# Patient Record
Sex: Female | Born: 1970 | Race: Black or African American | Hispanic: No | Marital: Married | Smoking: Former smoker
Health system: Southern US, Community
[De-identification: ages and names within clinical notes are randomized; demographics above are authoritative.]

## PROBLEM LIST (undated history)

## (undated) DIAGNOSIS — J449 Chronic obstructive pulmonary disease, unspecified: Secondary | ICD-10-CM

## (undated) DIAGNOSIS — J45909 Unspecified asthma, uncomplicated: Secondary | ICD-10-CM

## (undated) HISTORY — PX: TUBAL LIGATION: SHX77

---

## 2005-08-27 ENCOUNTER — Emergency Department: Payer: Self-pay | Admitting: Unknown Physician Specialty

## 2010-12-24 ENCOUNTER — Emergency Department: Payer: Self-pay | Admitting: Emergency Medicine

## 2012-10-20 ENCOUNTER — Ambulatory Visit: Payer: Self-pay

## 2012-10-23 ENCOUNTER — Ambulatory Visit: Payer: Self-pay

## 2013-12-13 ENCOUNTER — Ambulatory Visit: Payer: Self-pay

## 2015-06-02 ENCOUNTER — Other Ambulatory Visit: Payer: Self-pay | Admitting: Preventative Medicine

## 2015-06-02 DIAGNOSIS — Z1239 Encounter for other screening for malignant neoplasm of breast: Secondary | ICD-10-CM

## 2015-06-16 ENCOUNTER — Ambulatory Visit
Admission: RE | Admit: 2015-06-16 | Discharge: 2015-06-16 | Disposition: A | Discharge status: Home / Self Care | Payer: BLUE CROSS/BLUE SHIELD | Source: Ambulatory Visit | Attending: Preventative Medicine | Admitting: Preventative Medicine

## 2015-06-16 DIAGNOSIS — Z1239 Encounter for other screening for malignant neoplasm of breast: Secondary | ICD-10-CM

## 2015-06-16 DIAGNOSIS — Z1231 Encounter for screening mammogram for malignant neoplasm of breast: Secondary | ICD-10-CM | POA: Insufficient documentation

## 2016-07-03 ENCOUNTER — Other Ambulatory Visit: Payer: Self-pay | Admitting: Family Medicine

## 2016-07-03 ENCOUNTER — Other Ambulatory Visit: Payer: Self-pay | Admitting: Preventative Medicine

## 2016-07-15 ENCOUNTER — Encounter (INDEPENDENT_AMBULATORY_CARE_PROVIDER_SITE_OTHER): Payer: Self-pay

## 2016-07-15 ENCOUNTER — Ambulatory Visit
Admission: RE | Admit: 2016-07-15 | Discharge: 2016-07-15 | Disposition: A | Discharge status: Home / Self Care | Payer: Self-pay | Source: Ambulatory Visit | Attending: Oncology | Admitting: Oncology

## 2016-07-15 ENCOUNTER — Encounter: Payer: Self-pay | Admitting: *Deleted

## 2016-07-15 ENCOUNTER — Ambulatory Visit: Payer: Self-pay | Attending: Oncology | Admitting: *Deleted

## 2016-07-15 VITALS — BP 128/85 | HR 86 | Temp 98.1°F | Ht 64.57 in | Wt 195.8 lb

## 2016-07-15 DIAGNOSIS — Z0000 Encounter for general adult medical examination without abnormal findings: Secondary | ICD-10-CM

## 2016-07-15 NOTE — Patient Instructions (Signed)
Gave patient hand-out, Women Staying Healthy, Active and Well from [facility], with education on breast health, pap smears, heart and colon health. 

## 2016-07-15 NOTE — Progress Notes (Signed)
Subjective:     Patient ID: Nicole Mason, female   DOB: 1970/10/12, 45 y.o.   MRN: [id number]  HPI   Review of Systems     Objective:   Physical Exam  Pulmonary/Chest: Right breast exhibits no inverted nipple, no mass, no nipple discharge, no skin change and no tenderness. Left breast exhibits no inverted nipple, no mass, no nipple discharge, no skin change and no tenderness. Breasts are symmetrical.       Assessment:     45 [facility]  Clinical breast exam unremarkable.  Taught self breast awareness.  Patient has been screened for eligibility.  She does not have any insurance, Medicare or Medicaid.  She also meets financial eligibility.  Hand-out given on the Affordable Care Act.    Plan:     Screening mammogram ordered.  Will follow-up per [facility] protocol.

## 2016-07-16 ENCOUNTER — Encounter: Payer: Self-pay | Admitting: *Deleted

## 2016-07-16 NOTE — Progress Notes (Signed)
Letter mailed from the [facility] to inform patient of her normal mammogram results.  Patient is to follow-up with annual screening in one year.  [facility] to [place]. 

## 2017-08-25 ENCOUNTER — Encounter (INDEPENDENT_AMBULATORY_CARE_PROVIDER_SITE_OTHER): Payer: Self-pay

## 2017-08-25 ENCOUNTER — Other Ambulatory Visit: Payer: Self-pay

## 2017-08-25 ENCOUNTER — Ambulatory Visit
Admission: RE | Admit: 2017-08-25 | Discharge: 2017-08-25 | Disposition: A | Discharge status: Home / Self Care | Payer: Self-pay | Source: Ambulatory Visit | Attending: Oncology | Admitting: Oncology

## 2017-08-25 ENCOUNTER — Ambulatory Visit: Payer: Self-pay | Attending: Oncology

## 2017-08-25 VITALS — Ht 64.0 in | Wt 204.0 lb

## 2017-08-25 DIAGNOSIS — Z0000 Encounter for general adult medical examination without abnormal findings: Secondary | ICD-10-CM

## 2017-08-25 NOTE — Progress Notes (Signed)
Phoned patient with Bir[facility] 1 mammogram result.  Patient  Instructed to return for annual screening.  Copy to [facility]IS.

## 2017-08-25 NOTE — Progress Notes (Signed)
Subjective:     Patient ID: Nicole Mason, female   DOB: [birth year], 47 y.o.   MRN: [id number]  HPI   Review of Systems     Objective:   Physical Exam  Pulmonary/Chest: Right breast exhibits no inverted nipple, no mass, no nipple discharge, no skin change and no tenderness. Left breast exhibits no inverted nipple, no mass, no nipple discharge, no skin change and no tenderness. Breasts are symmetrical.    Bilateral symmetrical upper breast fibroglandular tissue       Assessment:  47 year old patient presents for [facility] clinic visit.  Patient screened, and meets [facility] eligibility.  Patient does not have insurance, Medicare or Medicaid.  Handout given on Affordable Care Act.  Instructed patient on breast self-exam using teach back method.  CBE unremarkable.  No mass or lump palpated.   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    Plan:     Sent for bilateral screening mammogram.

## 2018-10-14 ENCOUNTER — Encounter (INDEPENDENT_AMBULATORY_CARE_PROVIDER_SITE_OTHER): Payer: Self-pay

## 2018-10-14 ENCOUNTER — Ambulatory Visit
Admission: RE | Admit: 2018-10-14 | Discharge: 2018-10-14 | Disposition: A | Discharge status: Home / Self Care | Payer: Self-pay | Source: Ambulatory Visit | Attending: Oncology | Admitting: Oncology

## 2018-10-14 ENCOUNTER — Other Ambulatory Visit: Payer: Self-pay

## 2018-10-14 ENCOUNTER — Ambulatory Visit: Payer: Self-pay | Attending: Oncology

## 2018-10-14 VITALS — BP 114/87 | HR 121 | Temp 97.4°F | Ht 64.0 in | Wt 217.0 lb

## 2018-10-14 DIAGNOSIS — Z0000 Encounter for general adult medical examination without abnormal findings: Secondary | ICD-10-CM

## 2018-10-14 NOTE — Progress Notes (Signed)
  Subjective:     Patient ID: Nicole Mason, female   DOB: 08-16-1970, 48 y.o.   MRN: [id number]  HPI   Review of Systems     Objective:   Physical Exam Chest:     Breasts:        Right: No swelling, bleeding, inverted nipple, mass, nipple discharge, skin change or tenderness.        Left: No swelling, bleeding, inverted nipple, mass, nipple discharge, skin change or tenderness.        Assessment:     48 year old patient returns to [facility] for annual screening.  States she is no longer working.  Patient screened, and meets [facility] eligibility.  Patient does not have insurance, Medicare or Medicaid.  Handout given on Affordable Care Act. Clinical breast exam unremarkable.  No mass or lump palpated.  Instructed patient on breast self awareness using teach back method.  Patient reports her mother was diagnosed with breast cancer at age 21, and is now deceased.  Patient thinks she has had genetic testing performed at [facility].      Plan:     Sent for bilateral screening mammogram.

## 2018-10-21 NOTE — Progress Notes (Signed)
Letter mailed from [facility] to notify of normal mammogram results.  Patient to return in one year for annual screening.  Copy to [facility]IS. 

## 2019-10-19 ENCOUNTER — Other Ambulatory Visit: Payer: Self-pay

## 2019-10-19 ENCOUNTER — Ambulatory Visit: Payer: Self-pay | Attending: Oncology

## 2019-10-19 ENCOUNTER — Ambulatory Visit
Admission: RE | Admit: 2019-10-19 | Discharge: 2019-10-19 | Disposition: A | Discharge status: Home / Self Care | Payer: Self-pay | Source: Ambulatory Visit | Attending: Oncology | Admitting: Oncology

## 2019-10-19 VITALS — BP 151/85 | HR 78 | Temp 96.6°F | Ht 64.5 in | Wt 193.0 lb

## 2019-10-19 DIAGNOSIS — Z803 Family history of malignant neoplasm of breast: Secondary | ICD-10-CM | POA: Insufficient documentation

## 2019-10-19 DIAGNOSIS — Z1231 Encounter for screening mammogram for malignant neoplasm of breast: Secondary | ICD-10-CM | POA: Insufficient documentation

## 2019-10-19 DIAGNOSIS — Z0000 Encounter for general adult medical examination without abnormal findings: Secondary | ICD-10-CM

## 2019-10-19 NOTE — Progress Notes (Signed)
  Subjective:     Patient ID: Nicole Mason, female   DOB: 1970-09-07, 49 y.o.   MRN: [id number]  HPI   Review of Systems     Objective:   Physical Exam Chest:     Breasts:        Right: No swelling, bleeding, inverted nipple, mass, nipple discharge, skin change or tenderness.        Left: No swelling, bleeding, inverted nipple, mass, nipple discharge, skin change or tenderness.          Assessment:     49 year old patient returns for [facility] clinic visit.  Patient has lost 20 lbs. since 2020 visit, and is very excited.  She is exercising with 33 year old grandson, and eating healthy meals. Patient screened, and meets [facility] eligibility.  Patient does not have insurance, Medicare or Medicaid. Instructed patient on breast self awareness using teach back method.  Clinical breast exam unremarkable. No mass or lump palpated.     Risk Assessment    Risk Scores      10/19/2019 10/14/2018   Last edited by: Jim Like, RN Scarlett Presto, RN   5-year risk: 1.8 % 1.7 %   Lifetime risk: 13.7 % 13.9 %         Plan:     Sent for bilateral screening mammogram.

## 2019-10-25 NOTE — Progress Notes (Signed)
Letter mailed from [facility] to notify of normal mammogram results.  Patient to return in one year for annual screening.  Copy to [facility]IS. 

## 2020-10-18 ENCOUNTER — Ambulatory Visit: Payer: Self-pay

## 2020-11-14 ENCOUNTER — Encounter: Payer: Self-pay | Admitting: Nurse Practitioner

## 2020-11-15 ENCOUNTER — Other Ambulatory Visit: Payer: Self-pay

## 2020-11-15 ENCOUNTER — Ambulatory Visit: Payer: Self-pay | Attending: Oncology

## 2020-11-15 ENCOUNTER — Ambulatory Visit
Admission: RE | Admit: 2020-11-15 | Discharge: 2020-11-15 | Disposition: A | Discharge status: Home / Self Care | Payer: Self-pay | Source: Ambulatory Visit | Attending: Oncology | Admitting: Oncology

## 2020-11-15 VITALS — BP 130/93 | HR 86 | Temp 98.2°F | Ht 63.0 in | Wt 178.2 lb

## 2020-11-15 DIAGNOSIS — Z0000 Encounter for general adult medical examination without abnormal findings: Secondary | ICD-10-CM | POA: Insufficient documentation

## 2020-11-15 NOTE — Progress Notes (Addendum)
  Subjective:     Patient ID: Nicole Mason, female   DOB: 1970-09-15, 50 y.o.   MRN: [id number]  HPI   Review of Systems     Objective:   Physical Exam Chest:  Breasts:     Right: No swelling, bleeding, inverted nipple, mass, nipple discharge, skin change or tenderness.     Left: No swelling, bleeding, inverted nipple, mass, nipple discharge, skin change or tenderness.          Assessment:     50 year old patient returns for [facility] screening.  Patient screened, and meets [facility] eligibility.  Patient does not have insurance, Medicare or Medicaid.  Instructed patient on breast self awareness using teach back method.  Clinical breast exam unremarkable.  No mass or lump palpated. Pelvic exam normal.   Patient is working out out, dancing, walking, to maintain weight loss. Risk Assessment    Risk Scores      11/15/2020 10/19/2019   Last edited by: Jim Like, RN Jim Like, RN   5-year risk: 1.8 % 1.8 %   Lifetime risk: 13.5 % 13.7 %            Plan:     Sent for bilateral screening mammogram.  Specimen collected for pap.

## 2020-11-20 LAB — IGP, APTIMA HPV: HPV Aptima: NEGATIVE

## 2020-11-21 NOTE — Progress Notes (Signed)
Phoned patient with normal mammogram, and Pap results.  Next pap due in 5 years.  To return next year for annual screening.  Copy to [facility]IS.

## 2021-04-06 ENCOUNTER — Encounter: Payer: Self-pay | Admitting: Emergency Medicine

## 2021-04-06 ENCOUNTER — Emergency Department: Payer: Self-pay

## 2021-04-06 ENCOUNTER — Inpatient Hospital Stay
Admission: EM | Admit: 2021-04-06 | Discharge: 2021-04-09 | DRG: 871 | Disposition: A | Discharge status: Home / Self Care | Payer: Self-pay | Attending: Internal Medicine | Admitting: Internal Medicine

## 2021-04-06 ENCOUNTER — Other Ambulatory Visit: Payer: Self-pay

## 2021-04-06 DIAGNOSIS — J9601 Acute respiratory failure with hypoxia: Secondary | ICD-10-CM | POA: Diagnosis present

## 2021-04-06 DIAGNOSIS — D649 Anemia, unspecified: Secondary | ICD-10-CM | POA: Diagnosis present

## 2021-04-06 DIAGNOSIS — R0602 Shortness of breath: Secondary | ICD-10-CM | POA: Diagnosis present

## 2021-04-06 DIAGNOSIS — Z20822 Contact with and (suspected) exposure to covid-19: Secondary | ICD-10-CM | POA: Diagnosis present

## 2021-04-06 DIAGNOSIS — Z885 Allergy status to narcotic agent status: Secondary | ICD-10-CM

## 2021-04-06 DIAGNOSIS — J9611 Chronic respiratory failure with hypoxia: Secondary | ICD-10-CM

## 2021-04-06 DIAGNOSIS — E876 Hypokalemia: Secondary | ICD-10-CM | POA: Diagnosis present

## 2021-04-06 DIAGNOSIS — G8929 Other chronic pain: Secondary | ICD-10-CM | POA: Diagnosis present

## 2021-04-06 DIAGNOSIS — J189 Pneumonia, unspecified organism: Principal | ICD-10-CM | POA: Diagnosis present

## 2021-04-06 DIAGNOSIS — R062 Wheezing: Secondary | ICD-10-CM

## 2021-04-06 DIAGNOSIS — Z803 Family history of malignant neoplasm of breast: Secondary | ICD-10-CM

## 2021-04-06 DIAGNOSIS — A419 Sepsis, unspecified organism: Principal | ICD-10-CM | POA: Diagnosis present

## 2021-04-06 DIAGNOSIS — M545 Low back pain, unspecified: Secondary | ICD-10-CM | POA: Diagnosis present

## 2021-04-06 DIAGNOSIS — F17210 Nicotine dependence, cigarettes, uncomplicated: Secondary | ICD-10-CM | POA: Diagnosis present

## 2021-04-06 DIAGNOSIS — Z8616 Personal history of COVID-19: Secondary | ICD-10-CM

## 2021-04-06 LAB — CBC WITH DIFFERENTIAL/PLATELET
Abs Immature Granulocytes: 0.04 10*3/uL (ref 0.00–0.07)
Basophils Absolute: 0 10*3/uL (ref 0.0–0.1)
Basophils Relative: 0 %
Eosinophils Absolute: 0.1 10*3/uL (ref 0.0–0.5)
Eosinophils Relative: 1 %
HCT: 41.4 % (ref 36.0–46.0)
Hemoglobin: 13.9 g/dL (ref 12.0–15.0)
Immature Granulocytes: 0 %
Lymphocytes Relative: 10 %
Lymphs Abs: 1 10*3/uL (ref 0.7–4.0)
MCH: 31.7 pg (ref 26.0–34.0)
MCHC: 33.6 g/dL (ref 30.0–36.0)
MCV: 94.3 fL (ref 80.0–100.0)
Monocytes Absolute: 0.5 10*3/uL (ref 0.1–1.0)
Monocytes Relative: 5 %
Neutro Abs: 8.1 10*3/uL — ABNORMAL HIGH (ref 1.7–7.7)
Neutrophils Relative %: 84 %
Platelets: 263 10*3/uL (ref 150–400)
RBC: 4.39 MIL/uL (ref 3.87–5.11)
RDW: 12.8 % (ref 11.5–15.5)
WBC: 9.8 10*3/uL (ref 4.0–10.5)
nRBC: 0 % (ref 0.0–0.2)

## 2021-04-06 LAB — COMPREHENSIVE METABOLIC PANEL
ALT: 21 U/L (ref 0–44)
AST: 24 U/L (ref 15–41)
Albumin: 4.1 g/dL (ref 3.5–5.0)
Alkaline Phosphatase: 102 U/L (ref 38–126)
Anion gap: 9 (ref 5–15)
BUN: 8 mg/dL (ref 6–20)
CO2: 26 mmol/L (ref 22–32)
Calcium: 9.4 mg/dL (ref 8.9–10.3)
Chloride: 103 mmol/L (ref 98–111)
Creatinine, Ser: 0.65 mg/dL (ref 0.44–1.00)
GFR, Estimated: >60 mL/min (ref >60)
Glucose, Bld: 114 mg/dL — ABNORMAL HIGH (ref 70–99)
Potassium: 3.4 mmol/L — ABNORMAL LOW (ref 3.5–5.1)
Sodium: 138 mmol/L (ref 135–145)
Total Bilirubin: 0.7 mg/dL (ref 0.3–1.2)
Total Protein: 8.2 g/dL — ABNORMAL HIGH (ref 6.5–8.1)

## 2021-04-06 LAB — URINALYSIS, COMPLETE (UACMP) WITH MICROSCOPIC
Bacteria, UA: NONE SEEN
Bilirubin Urine: NEGATIVE
Glucose, UA: NEGATIVE mg/dL
Ketones, ur: 5 mg/dL — AB
Leukocytes,Ua: NEGATIVE
Nitrite: NEGATIVE
Protein, ur: NEGATIVE mg/dL
Specific Gravity, Urine: >1.046 — ABNORMAL HIGH (ref 1.005–1.030)
pH: 5 (ref 5.0–8.0)

## 2021-04-06 LAB — TROPONIN I (HIGH SENSITIVITY): Troponin I (High Sensitivity): 6 ng/L (ref <18)

## 2021-04-06 LAB — RESP PANEL BY RT-PCR (FLU A&B, COVID) ARPGX2
Influenza A by PCR: NEGATIVE
Influenza B by PCR: NEGATIVE
SARS Coronavirus 2 by RT PCR: NEGATIVE

## 2021-04-06 LAB — PROCALCITONIN: Procalcitonin: <0.1 ng/mL

## 2021-04-06 LAB — BRAIN NATRIURETIC PEPTIDE: B Natriuretic Peptide: 34.6 pg/mL (ref 0.0–100.0)

## 2021-04-06 LAB — LACTIC ACID, PLASMA: Lactic Acid, Venous: 1.8 mmol/L (ref 0.5–1.9)

## 2021-04-06 LAB — HIV ANTIBODY (ROUTINE TESTING W REFLEX): HIV Screen 4th Generation wRfx: NONREACTIVE

## 2021-04-06 MED ORDER — ONDANSETRON HCL 4 MG PO TABS: 4.0000 mg | ORAL_TABLET | Freq: Four times a day (QID) | ORAL | Status: DC | PRN

## 2021-04-06 MED ORDER — SODIUM CHLORIDE 0.9 % IV SOLN
500.0000 mg | INTRAVENOUS | Status: DC
  Administered 2021-04-06 – 2021-04-08 (×3): 500 mg via INTRAVENOUS
  Filled 2021-04-06 (×4): qty 500

## 2021-04-06 MED ORDER — ACETAMINOPHEN 500 MG PO TABS
1000.0000 mg | ORAL_TABLET | Freq: Four times a day (QID) | ORAL | Status: DC | PRN
  Administered 2021-04-06: 1000 mg via ORAL
  Filled 2021-04-06: qty 2

## 2021-04-06 MED ORDER — ENOXAPARIN SODIUM 40 MG/0.4ML IJ SOSY
40.0000 mg | PREFILLED_SYRINGE | INTRAMUSCULAR | Status: DC
  Administered 2021-04-06 – 2021-04-08 (×3): 40 mg via SUBCUTANEOUS
  Filled 2021-04-06 (×3): qty 0.4

## 2021-04-06 MED ORDER — ACETAMINOPHEN 650 MG RE SUPP: 650.0000 mg | Freq: Four times a day (QID) | RECTAL | Status: DC | PRN

## 2021-04-06 MED ORDER — ONDANSETRON HCL 4 MG/2ML IJ SOLN: 4.0000 mg | Freq: Four times a day (QID) | INTRAMUSCULAR | Status: DC | PRN

## 2021-04-06 MED ORDER — METHYLPREDNISOLONE SODIUM SUCC 125 MG IJ SOLR
80.0000 mg | INTRAMUSCULAR | Status: AC
Start: 2021-04-07 — End: 2021-04-08
  Administered 2021-04-07 – 2021-04-08 (×2): 80 mg via INTRAVENOUS
  Filled 2021-04-06 (×2): qty 2

## 2021-04-06 MED ORDER — SODIUM CHLORIDE 0.9 % IV SOLN
2.0000 g | INTRAVENOUS | Status: DC
  Administered 2021-04-06 – 2021-04-08 (×3): 2 g via INTRAVENOUS
  Filled 2021-04-06 (×3): qty 20
  Filled 2021-04-06: qty 2

## 2021-04-06 MED ORDER — IPRATROPIUM-ALBUTEROL 0.5-2.5 (3) MG/3ML IN SOLN
3.0000 mL | Freq: Once | RESPIRATORY_TRACT | Status: AC
  Administered 2021-04-06: 3 mL via RESPIRATORY_TRACT
  Filled 2021-04-06: qty 3

## 2021-04-06 MED ORDER — IOHEXOL 350 MG/ML SOLN
75.0000 mL | Freq: Once | INTRAVENOUS | Status: AC | PRN
  Administered 2021-04-06: 75 mL via INTRAVENOUS

## 2021-04-06 MED ORDER — SODIUM CHLORIDE 0.9 % IV BOLUS
500.0000 mL | Freq: Once | INTRAVENOUS | Status: AC
  Administered 2021-04-06: 500 mL via INTRAVENOUS

## 2021-04-06 MED ORDER — METHYLPREDNISOLONE SODIUM SUCC 40 MG IJ SOLR
40.0000 mg | Freq: Two times a day (BID) | INTRAMUSCULAR | Status: DC
  Filled 2021-04-06: qty 1

## 2021-04-06 MED ORDER — SODIUM CHLORIDE 0.9 % IV BOLUS
1000.0000 mL | Freq: Once | INTRAVENOUS | Status: AC
  Administered 2021-04-06: 1000 mL via INTRAVENOUS

## 2021-04-06 MED ORDER — POTASSIUM CITRATE-CITRIC ACID 1100-334 MG/5ML PO SOLN
20.0000 meq | Freq: Once | ORAL | Status: AC
  Administered 2021-04-06: 20 meq via ORAL
  Filled 2021-04-06: qty 10

## 2021-04-06 NOTE — H&P (Signed)
History and Physical   Nicole Mason HER:[id number] DOB: 1970/08/26 DOA: 04/06/2021  PCP: Pcp, No  Patient coming from: home  I have personally briefly reviewed patient's old medical records in [place].  Chief Concern: Shortness of breath  HPI: Nicole Mason is a 50 y.o. female with medical history significant for chronic low back pain, frequently takes Goody powder, presents emergency department for chief concerns of shortness of breath cough, hypoxia.  At bedside patient was able to tell me her name, age, current calendar year, and her location.  She can identify her husband at bedside.  She reports that the symptoms started about Three weeks ago.  She states that she was given steroids, inhaler, antibiotics twice per day for seven days at urgent care/pharmacy center.  She states she was never tested for COVID-19 at that time.  She reports coughing that started about 1-2 weeks ago after completion of antibiotics and steroids that was prescribed to her from the outpatient urgent care.  She developed difficulty breathing for two days and it worsened today. She reports that ambulation around the house caused her to have difficulty breathing prompting her to present to the ED for further evaluation.  She endorses chest pain and abdominl pain with cough.   She denies nausea, vomiting, dysuria, hematuria. She endorsed diarrhea three weeks ago.   Social history: She lives with her husband, MIL, step son. quit 3 days ago, at her peak, she smoked 1/2 ppd. She denies etoh and recreational drug use. She has a store.   Vaccination history: She is vaccinated for covid 19, three doses of Pfizer.   ROS: Constitutional: no weight change, no fever ENT/Mouth: no sore throat, no rhinorrhea Eyes: no eye pain, no vision changes Cardiovascular: no chest pain, no dyspnea,  no edema, no palpitations Respiratory: no cough, no sputum, no wheezing Gastrointestinal: no nausea, no vomiting, no  diarrhea, no constipation Genitourinary: no urinary incontinence, no dysuria, no hematuria Musculoskeletal: no arthralgias, no myalgias Skin: no skin lesions, no pruritus, Neuro: + weakness, no loss of consciousness, no syncope Psych: no anxiety, no depression, + decrease appetite Heme/Lymph: no bruising, no bleeding  ED Course: Discussed with emergency medicine provider, patient requiring hospitalization for chief concerns of hypoxia with ambulation.  Per EDP, patient desatted to 87% on room air and with movement.  Vitals in the emergency department was remarkable for initially her temperature of 99.1, respiration rate of 16, heart rate 108, blood pressure 127/87, SPO2 of 99% on 2 L nasal cannula.  Labs in the emergency department was remarkable for serum sodium 138, potassium 3.4, chloride 103, bicarb 26, BUN of 8, serum creatinine of 0.65, nonfasting blood glucose 114, WBC 9.8, hemoglobin 13.9, platelets 263.  eGFR greater than 60.  COVID PCR was negative.  BNP 34.6.  Troponin was 6.  Lactic acid 1.8.  Pro-Cal less than 0.1.  EDP gave patient 1 treatment of duo nebs, sodium chloride a total of 2.5 L bolus.  1 dose of ceftriaxone and azithromycin.  Assessment/Plan  Active Problems:   Shortness of breath   # Shortness of breath with wheezing on auscultation # Requiring oxygen supplementation - CTA was positive for multifocal pneumonia - My clinical suspicion is that patient had COVID 3 weeks ago however she was never tested -Continue azithromycin and ceftriaxone at this time - Solu-Medrol twice daily  # Hypokalemia-Polycitra 20 mill equivalent once - A.m. magnesium check  # Chronic low back pain-extensive counseling to patient regarding cessation of  Goody powder - Advised patient to alternate of Goody powder if absolutely needed with acetaminophen - Stretching as tolerated including yoga  Chart reviewed.   DVT prophylaxis: Enoxaparin 40 mg subcutaneous Code Status: Full  code Diet: Heart healthy Family Communication: Updated spouse at bedside Disposition Plan: Pending clinical course Consults called: None at this time Admission status: MedSurg, observation, telemetry ordered for 24 hours  History reviewed. No pertinent past medical history.  Past Surgical History:  Procedure Laterality Date   TUBAL LIGATION     Social History:  reports that she quit smoking 2 days ago. Her smoking use included cigarettes. She has a 12.50 pack-year smoking history. She does not have any smokeless tobacco history on file. No history on file for alcohol use and drug use.  Allergies  Allergen Reactions   Oxycodone-Acetaminophen     Other reaction(s): Other (See Comments)   Family History  Problem Relation Age of Onset   Breast cancer Mother 12   Family history: Family history reviewed and not pertinent  Prior to Admission medications   Goody powder   Physical Exam: Vitals:   04/06/21 1330 04/06/21 1700 04/06/21 1800 04/06/21 1900  BP: 129/75 126/69 (!) 128/99 134/79  Pulse: (!) 118 (!) 107 (!) 103 (!) 103  Resp: (!) 23 (!) 27    Temp:      TempSrc:      SpO2: 96% 97% 99% 95%  Weight:      Height:       Constitutional: appears younger than chronological age, NAD, calm, comfortable Eyes: PERRL, lids and conjunctivae normal ENMT: Mucous membranes are moist. Posterior pharynx clear of any exudate or lesions. Age-appropriate dentition. Hearing appropriate Neck: normal, supple, no masses, no thyromegaly Respiratory: + diffuse wheezing on auscultation, no crackles. Normal respiratory effort. No accessory muscle use.  Cardiovascular: Regular rate and rhythm, no murmurs / rubs / gallops. No extremity edema. 2+ pedal pulses. No carotid bruits.  Abdomen: no tenderness, no masses palpated, no hepatosplenomegaly. Bowel sounds positive.  Musculoskeletal: no clubbing / cyanosis. No joint deformity upper and lower extremities. Good ROM, no contractures, no atrophy. Normal  muscle tone.  Skin: no rashes, lesions, ulcers. No induration Neurologic: Sensation intact. Strength 5/5 in all 4.  Psychiatric: Normal judgment and insight. Alert and oriented x 3. Normal mood.   EKG: independently reviewed, showing sinus tachycardia with rate of 116, QTc 419, LVH  Chest x-ray on Admission: I personally reviewed and I agree with radiologist reading as below.  DG Chest 2 View  Result Date: 04/06/2021 CLINICAL DATA:  Cough, hypoxia.  Two week history of cough. EXAM: CHEST - 2 VIEW COMPARISON:  Chest radiograph 12/25/2010 FINDINGS: The heart size and mediastinal contours are within normal limits. Both lungs are clear. No pleural effusion or pneumothorax. Multilevel degenerative changes in the distal thoracic spine. IMPRESSION: No active cardiopulmonary disease. Electronically Signed   By: Ileana Roup M.D.   On: 04/06/2021 12:42   CT Angio Chest PE W and/or Wo Contrast  Result Date: 04/06/2021 CLINICAL DATA:  50 year old female with cough, suspected pulmonary embolism. EXAM: CT ANGIOGRAPHY CHEST WITH CONTRAST TECHNIQUE: Multidetector CT imaging of the chest was performed using the standard protocol during bolus administration of intravenous contrast. Multiplanar CT image reconstructions and MIPs were obtained to evaluate the vascular anatomy. CONTRAST:  Seventy-five mL Omnipaque 350, intravenous COMPARISON:  None. FINDINGS: Cardiovascular: Satisfactory opacification of the pulmonary arteries to the segmental level. No evidence of pulmonary embolism. Normal heart size. No pericardial effusion. Mediastinum/Nodes:  No enlarged mediastinal, hilar, or axillary lymph nodes. Thyroid gland, trachea, and esophagus demonstrate no significant findings. Lungs/Pleura: Scattered bilateral, upper lobe predominant geographical foci of consolidative opacification. No pneumothorax or pleural effusion. No suspicious pulmonary nodules. Upper Abdomen: The visualized upper abdomen is within normal limits.  Musculoskeletal: No chest wall abnormality. No acute or significant osseous findings. Review of the MIP images confirms the above findings. IMPRESSION: Vascular: No evidence of pulmonary embolism. Non-Vascular: Multifocal pneumonia, predominant in the upper lobes. Ruthann Cancer, MD [facility] Electronically Signed   By: Ruthann Cancer M.D.   On: 04/06/2021 13:34    Labs on Admission: I have personally reviewed following labs  CBC: Recent Labs  Lab 04/06/21 1101  WBC 9.8  NEUTROABS 8.1*  HGB 13.9  HCT 41.4  MCV 94.3  PLT 588   Basic Metabolic Panel: Recent Labs  Lab 04/06/21 1101  NA 138  K 3.4*  CL 103  CO2 26  GLUCOSE 114*  BUN 8  CREATININE 0.65  CALCIUM 9.4   GFR: Estimated Creatinine Clearance: 84.7 mL/min (by C-G formula based on SCr of 0.65 mg/dL).  Liver Function Tests: Recent Labs  Lab 04/06/21 1101  AST 24  ALT 21  ALKPHOS 102  BILITOT 0.7  PROT 8.2*  ALBUMIN 4.1   Dr. Tobie Poet [facility]  If 7PM-7AM, please contact overnight-coverage provider If 7AM-7PM, please contact day coverage provider [email or web address]  04/06/2021, 8:30 PM

## 2021-04-06 NOTE — ED Notes (Signed)
Family at bedside assisted the patient to the room commode.

## 2021-04-06 NOTE — ED Provider Notes (Signed)
[facility] Emergency Department Provider Note  ____________________________________________   Event Date/Time   First MD Initiated Contact with Patient 04/06/21 1107     (approximate)  I have reviewed the triage vital signs and the nursing notes.   HISTORY  Chief Complaint Cough    HPI Nicole Mason is a 50 y.o. female with smoking history who comes in with concern for continued cough.  Patient states that she was seen about a month ago at urgent care for cough and they did not do a chest x-ray or COVID swab and they started her on steroids, inhaler, antibiotics.  She is unsure which antibiotics.  She states that she was taking that and started to feel better but she continues to have a cough.  This has been intermittent, nothing makes it better or worse.  She also reports some pain with the coughing in her chest.  Denies any abdominal pain at this time, urinary symptoms.  Patient was noted to be satting 89% in triage so placed on 2 L          History reviewed. No pertinent past medical history.  There are no problems to display for this patient.   Past Surgical History:  Procedure Laterality Date   TUBAL LIGATION      Prior to Admission medications   Not on File    Allergies Oxycodone-acetaminophen  Family History  Problem Relation Age of Onset   Breast cancer Mother 55    Social History Social History   Tobacco Use   Smoking status: Former    Packs/day: 0.50    Years: 25.00    Pack years: 12.50    Types: Cigarettes    Quit date: 04/04/2021      Review of Systems Constitutional: No fever/chills Eyes: No visual changes. ENT: No sore throat. Cardiovascular: Positive chest pain Respiratory: Positive for SOB, getting better.  Cough Gastrointestinal: No abdominal pain.  No nausea, no vomiting.  No diarrhea.  No constipation. Genitourinary: Negative for dysuria. Musculoskeletal: Negative for back pain. Skin: Negative for  rash. Neurological: Negative for headaches, focal weakness or numbness. All other ROS negative ____________________________________________   PHYSICAL EXAM:  VITAL SIGNS: ED Triage Vitals  Enc Vitals Group     BP 04/06/21 1047 127/83     Pulse Rate 04/06/21 1047 (!) 113     Resp 04/06/21 1047 16     Temp 04/06/21 1047 99.1 F (37.3 C)     Temp Source 04/06/21 1047 Oral     SpO2 04/06/21 1047 (!) 89 %     Weight 04/06/21 1048 178 lb 2.1 oz (80.8 kg)     Height 04/06/21 1048 5\' 3"  (1.6 m)     Head Circumference --      Peak Flow --      Pain Score 04/06/21 1047 0     Pain Loc --      Pain Edu? --      Excl. in GC? --     Constitutional: Alert and oriented. Well appearing and in no acute distress. Eyes: Conjunctivae are normal. EOMI. Head: Atraumatic. Nose: No congestion/rhinnorhea. Mouth/Throat: Mucous membranes are moist.   Neck: No stridor. Trachea Midline. FROM Cardiovascular: tachy, regular rhythm. Grossly normal heart sounds.  Good peripheral circulation. Respiratory: Clear lungs, no increased work of breathing however is on 2 L Gastrointestinal: Soft and nontender. No distention. No abdominal bruits.  Musculoskeletal: No lower extremity tenderness nor edema.  No joint effusions. Neurologic:  Normal  speech and language. No gross focal neurologic deficits are appreciated.  Skin:  Skin is warm, dry and intact. No rash noted. Psychiatric: Mood and affect are normal. Speech and behavior are normal. GU: Deferred   ____________________________________________   LABS (all labs ordered are listed, but only abnormal results are displayed)  Labs Reviewed  CBC WITH DIFFERENTIAL/PLATELET - Abnormal; Notable for the following components:      Result Value   Neutro Abs 8.1 (*)    All other components within normal limits  RESP PANEL BY RT-PCR (FLU A&B, COVID) ARPGX2  LACTIC ACID, PLASMA  LACTIC ACID, PLASMA  COMPREHENSIVE METABOLIC PANEL  URINALYSIS, COMPLETE (UACMP)  WITH MICROSCOPIC  BRAIN NATRIURETIC PEPTIDE  PROCALCITONIN   ____________________________________________   ED ECG REPORT I, Concha Se, the attending physician, personally viewed and interpreted this ECG.  Sinus tachycardia rate of 116, no ST elevation, no T wave inversions, normal intervals   ____________________________________________  RADIOLOGY Vela Prose, personally viewed and evaluated these images (plain radiographs) as part of my medical decision making, as well as reviewing the written report by the radiologist.  ED MD interpretation:  No PNA   Official radiology report(s): DG Chest 2 View  Result Date: 04/06/2021 CLINICAL DATA:  Cough, hypoxia.  Two week history of cough. EXAM: CHEST - 2 VIEW COMPARISON:  Chest radiograph 12/25/2010 FINDINGS: The heart size and mediastinal contours are within normal limits. Both lungs are clear. No pleural effusion or pneumothorax. Multilevel degenerative changes in the distal thoracic spine. IMPRESSION: No active cardiopulmonary disease. Electronically Signed   By: Sherron Ales M.D.   On: 04/06/2021 12:42   CT Angio Chest PE W and/or Wo Contrast  Result Date: 04/06/2021 CLINICAL DATA:  50 year old female with cough, suspected pulmonary embolism. EXAM: CT ANGIOGRAPHY CHEST WITH CONTRAST TECHNIQUE: Multidetector CT imaging of the chest was performed using the standard protocol during bolus administration of intravenous contrast. Multiplanar CT image reconstructions and MIPs were obtained to evaluate the vascular anatomy. CONTRAST:  Seventy-five mL Omnipaque 350, intravenous COMPARISON:  None. FINDINGS: Cardiovascular: Satisfactory opacification of the pulmonary arteries to the segmental level. No evidence of pulmonary embolism. Normal heart size. No pericardial effusion. Mediastinum/Nodes: No enlarged mediastinal, hilar, or axillary lymph nodes. Thyroid gland, trachea, and esophagus demonstrate no significant findings. Lungs/Pleura:  Scattered bilateral, upper lobe predominant geographical foci of consolidative opacification. No pneumothorax or pleural effusion. No suspicious pulmonary nodules. Upper Abdomen: The visualized upper abdomen is within normal limits. Musculoskeletal: No chest wall abnormality. No acute or significant osseous findings. Review of the MIP images confirms the above findings. IMPRESSION: Vascular: No evidence of pulmonary embolism. Non-Vascular: Multifocal pneumonia, predominant in the upper lobes. Marliss Coots, MD [facility]iology Electronically Signed   By: Marliss Coots M.D.   On: 04/06/2021 13:34    ____________________________________________   PROCEDURES  Procedure(s) performed (including Critical Care):  .1-3 Lead EKG Interpretation  Date/Time: 04/06/2021 1:54 PM Performed by: Concha Se, MD Authorized by: Concha Se, MD     Interpretation: abnormal     ECG rate:  100s   ECG rate assessment: tachycardic     Rhythm: sinus tachycardia     Ectopy: none     Conduction: normal   .Critical Care  Date/Time: 04/06/2021 1:54 PM Performed by: Concha Se, MD Authorized by: Concha Se, MD   Critical care provider statement:    Critical care time (minutes):  45   Critical care was necessary to  treat or prevent imminent or life-threatening deterioration of the following conditions:  Sepsis   Critical care was time spent personally by me on the following activities:  Discussions with consultants, evaluation of patient's response to treatment, examination of patient, ordering and performing treatments and interventions, ordering and review of laboratory studies, ordering and review of radiographic studies, pulse oximetry, re-evaluation of patient's condition, obtaining history from patient or surrogate and review of old charts   ____________________________________________   INITIAL IMPRESSION / ASSESSMENT AND PLAN / ED  COURSE   Nicole Mason was evaluated in Emergency Department on 04/06/2021 for the symptoms described in the history of present illness. She was evaluated in the context of the global COVID-19 pandemic, which necessitated consideration that the patient might be at risk for infection with the SARS-CoV-2 virus that causes COVID-19. Institutional protocols and algorithms that pertain to the evaluation of patients at risk for COVID-19 are in a state of rapid change based on information released by regulatory bodies including the CDC and federal and state organizations. These policies and algorithms were followed during the patient's care in the ED.     Pt presents with SOB. Differential includes: PNA-will get xray to evaluation Anemia-CBC to evaluate ACS- will get trops Arrhythmia-Will get EKG and keep on monitor.  COVID- will get testing per algorithm. PE-consider given the tachycardia and hypoxia continued not feeling well.  Patient was placed on 2 L due to hypoxia.   1:56 PM patient's x-ray was negative for pneumonia and I proceeded with CT PE.  This is concerning for multifocal pneumonia.  We will start patient on IV antibiotics given she is meet sepsis criteria.  Blood cultures were drawn and lactate had already been done that was normal.  Patient was already given 1 L of fluid.  Further fluid resuscitation will be given due to Patient's continued tachycardia and normal BNP so no evidence of heart failure.  Will discuss possible team for admission    ____________________________________________   FINAL CLINICAL IMPRESSION(S) / ED DIAGNOSES   Final diagnoses:  Community acquired pneumonia, unspecified laterality  Sepsis, due to unspecified organism, unspecified whether acute organ dysfunction present (HCC)  Acute respiratory failure with hypoxia (HCC)     MEDICATIONS GIVEN DURING THIS VISIT:  Medications  cefTRIAXone (ROCEPHIN) 2 g in sodium chloride 0.9 % 100 mL IVPB (has no  administration in time range)  azithromycin (ZITHROMAX) 500 mg in sodium chloride 0.9 % 250 mL IVPB (has no administration in time range)  sodium chloride 0.9 % bolus 1,000 mL (has no administration in time range)  sodium chloride 0.9 % bolus 500 mL (has no administration in time range)  sodium chloride 0.9 % bolus 1,000 mL (1,000 mLs Intravenous New Bag/Given 04/06/21 1230)  ipratropium-albuterol (DUONEB) 0.5-2.5 (3) MG/3ML nebulizer solution 3 mL (3 mLs Nebulization Given 04/06/21 1231)  iohexol (OMNIPAQUE) 350 MG/ML injection 75 mL (75 mLs Intravenous Contrast Given 04/06/21 1316)     ED Discharge Orders     None        Note:  This document was prepared using Dragon voice recognition software and may include unintentional dictation errors.   Concha Se, MD 04/06/21 [phone]

## 2021-04-06 NOTE — Progress Notes (Signed)
Elink monitoring for sepsis protocol 

## 2021-04-06 NOTE — ED Notes (Signed)
Report given to Edna RN

## 2021-04-06 NOTE — ED Notes (Signed)
Vital signs at 1300 are not accurate. Patient was found yelling, standing on the stretcher, crying, stating she could not breathe. Patient had taken her O2 off, blood pressure cuff off. Patient was 88% with a RR of 28-30. Patient was placed back on O2 at 4L, sats increased as patient was coached to control her breathing and become more calm. Sats were 97% on 4L. Patient was given a cool cloth. Patient stated she took 6 puffs of her home inhaler. Pulse in the 120's. MD informed.

## 2021-04-06 NOTE — ED Notes (Signed)
Pt transported to CT ?

## 2021-04-06 NOTE — ED Notes (Signed)
Patient ambulated with a steady gait to room commode. Patient appeared to anxious and was breathing rapidly, states that moving made her "hot." Patient fanned herself with styrofoam lid and remained at mid 90's for pulse ox. Patient was at 97% when laying back down on stretcher.

## 2021-04-06 NOTE — Consult Note (Signed)
CODE SEPSIS - PHARMACY COMMUNICATION  **Broad Spectrum Antibiotics should be administered within 1 hour of Sepsis diagnosis**  Time Code Sepsis Called/Page Received: 1352  Antibiotics Ordered: azithromycin & ceftriaxone  Time of 1st antibiotic administration: 1504  Additional action taken by pharmacy: message to nurse @1427    If necessary, Name of Provider/Nurse Contacted: , RN    Nicole Mason ,PharmD Clinical Pharmacist  04/06/2021  1:59 PM

## 2021-04-06 NOTE — ED Notes (Signed)
Patient was repositioned on stretcher, lying on her side with a pillow beneath her hips and back. IV is patent. Fluids are infusing. Patient is on  Her phone and has to be reminded to keep arm straight. Rolled up wash cloth placed in right AC as a reminder.

## 2021-04-06 NOTE — ED Notes (Signed)
Patient taken to imaging. 

## 2021-04-06 NOTE — ED Notes (Signed)
Patient appears comfortable, RR 18-20. Pulse ox is 99-10% on 3L. O2 decreased to 2 L at this time.

## 2021-04-06 NOTE — ED Triage Notes (Signed)
C/O two week history of cough. States seen through Urgent Care and given an inhaler, out of now, antibiotics and steroids.  Symptoms initially improved but have persisted.

## 2021-04-06 NOTE — ED Notes (Signed)
Placed on 2l/  

## 2021-04-06 NOTE — Consult Note (Signed)
Pharmacy Antibiotic Note  Nicole Mason is a 50 y.o. female admitted on 04/06/2021 with sepsis.  Pharmacy has been consulted for ceftriaxone and Azithromycin dosing.  Plan: 50 y.o. female with smoking history who comes in with concern for continued cough.  Patient was seen a month ago at urgent care for possible PNA.  Treated with ABX's and inhalers.  Cough still unresolved. Negative for Covid-19.  Height: 5\' 3"  (160 cm) Weight: 80.8 kg (178 lb 2.1 oz) IBW/kg (Calculated) : 52.4  Temp (24hrs), Avg:99.1 F (37.3 C), Min:99.1 F (37.3 C), Max:99.1 F (37.3 C)  Recent Labs  Lab 04/06/21 1101  WBC 9.8  CREATININE 0.65  LATICACIDVEN 1.8    Estimated Creatinine Clearance: 84.7 mL/min (by C-G formula based on SCr of 0.65 mg/dL).    Allergies  Allergen Reactions   Oxycodone-Acetaminophen     Other reaction(s): Other (See Comments)    Antimicrobials this admission: Ceftriaxone 2gms  >> in ED Azithromycin 500mg   >> in ED Inpt: Ceftriaxone 2gms Daily>> Azithromycin 500mg  q24hrs>>  Microbiology results: 04/08/21 BCx: pending 0826 MRSA PCR: ordered  Thank you for allowing pharmacy to be a part of this patient's care.  04/06/2021 5:37 PM

## 2021-04-06 NOTE — ED Notes (Signed)
O2 increased to 3L for patient's comfort. Patient became anxious, fanning herself, states she is hot. Temperature is again 98.9 oral. Patient is able to calm herself after EKG leads were removed.

## 2021-04-07 ENCOUNTER — Encounter: Payer: Self-pay | Admitting: Internal Medicine

## 2021-04-07 DIAGNOSIS — A419 Sepsis, unspecified organism: Principal | ICD-10-CM

## 2021-04-07 DIAGNOSIS — J9601 Acute respiratory failure with hypoxia: Secondary | ICD-10-CM

## 2021-04-07 DIAGNOSIS — J9602 Acute respiratory failure with hypercapnia: Secondary | ICD-10-CM

## 2021-04-07 DIAGNOSIS — J189 Pneumonia, unspecified organism: Secondary | ICD-10-CM

## 2021-04-07 LAB — CBC
HCT: 38.8 % (ref 36.0–46.0)
Hemoglobin: 13.1 g/dL (ref 12.0–15.0)
MCH: 32.2 pg (ref 26.0–34.0)
MCHC: 33.8 g/dL (ref 30.0–36.0)
MCV: 95.3 fL (ref 80.0–100.0)
Platelets: 229 10*3/uL (ref 150–400)
RBC: 4.07 MIL/uL (ref 3.87–5.11)
RDW: 12.9 % (ref 11.5–15.5)
WBC: 11.3 10*3/uL — ABNORMAL HIGH (ref 4.0–10.5)
nRBC: 0 % (ref 0.0–0.2)

## 2021-04-07 LAB — BASIC METABOLIC PANEL
Anion gap: 8 (ref 5–15)
BUN: 6 mg/dL (ref 6–20)
CO2: 26 mmol/L (ref 22–32)
Calcium: 9.1 mg/dL (ref 8.9–10.3)
Chloride: 105 mmol/L (ref 98–111)
Creatinine, Ser: 0.53 mg/dL (ref 0.44–1.00)
GFR, Estimated: >60 mL/min (ref >60)
Glucose, Bld: 94 mg/dL (ref 70–99)
Potassium: 3.7 mmol/L (ref 3.5–5.1)
Sodium: 139 mmol/L (ref 135–145)

## 2021-04-07 LAB — PROCALCITONIN: Procalcitonin: <0.1 ng/mL

## 2021-04-07 LAB — MAGNESIUM: Magnesium: 2.2 mg/dL (ref 1.7–2.4)

## 2021-04-07 MED ORDER — LORAZEPAM 2 MG/ML IJ SOLN
0.5000 mg | INTRAMUSCULAR | Status: DC | PRN
  Administered 2021-04-07 (×3): 0.5 mg via INTRAVENOUS
  Filled 2021-04-07 (×3): qty 1

## 2021-04-07 NOTE — Progress Notes (Signed)
PROGRESS NOTE    Nicole Mason  QIW:[id number] DOB: [birth year] DOA: 04/06/2021 PCP: Oneita Hurt, No   Brief Narrative:  Nicole Mason is a 50 y.o. female with medical history significant for chronic low back pain, frequently takes Goody powder, presents emergency department for chief concerns of shortness of breath cough, hypoxia. She reports that the symptoms started about 3 weeks ago.  She states that she was given steroids, inhaler, antibiotics twice per day for seven days at urgent care/pharmacy center.  She states she was never tested for COVID-19 at that time. She reports coughing that started about 1-2 weeks ago after completion of antibiotics and steroids that was prescribed to her from the outpatient urgent care.  She developed difficulty breathing for two days and it worsened today. She reports that ambulation around the house caused her to have difficulty breathing prompting her to present to the ED for further evaluation.   Assessment & Plan:   Acute hypoxic respiratory failure, POA Sepsis secondary to multifocal PNA - Confirmed on imaging; leukocytosis and tachycardia confirm sepsis criteria - Continue azithromycin and ceftriaxone at this time - Solu-Medrol twice daily   Hypokalemia - Repleted, follow repeat labs   Chronic low back pain-extensive counseling to patient regarding cessation of Goody powder - Advised patient to alternate of Goody powder if absolutely needed with acetaminophen - Outpatient pain management follow-up, may benefit from outpatient imaging as well given chronicity of her symptoms  DVT prophylaxis: Lovenox Code Status: Full Family Communication: None present  Status is: Inpatient  Dispo: The patient is from: Home              Anticipated d/c is to: Home              Anticipated d/c date is: 48 to 72 hours              Patient currently not medically stable for discharge  Consultants:  None  Procedures:  None  Antimicrobials:  Ceftriaxone,  azithromycin  Subjective: No acute issues or events overnight, respiratory status stabilizing if not minimally improving over the past 24 hours.  Denies nausea vomiting diarrhea constipation headache fevers chills or chest pain  Objective: Vitals:   04/07/21 0500 04/07/21 0515 04/07/21 0530 04/07/21 0619  BP: 106/81     Pulse: 99 99 97   Resp: 19 (!) 21 19   Temp:      TempSrc:      SpO2: 96% 95% 99%   Weight:    73.3 kg  Height:    5\' 4"  (1.626 m)    Intake/Output Summary (Last 24 hours) at 04/07/2021 0726 Last data filed at 04/07/2021 04/09/2021 Gross per 24 hour  Intake 1720 ml  Output 102 ml  Net 1618 ml   Filed Weights   04/06/21 1048 04/07/21 0619  Weight: 80.8 kg 73.3 kg    Examination:  General:  Pleasantly resting in bed, No acute distress. HEENT:  Normocephalic atraumatic.  Sclerae nonicteric, noninjected.  Extraocular movements intact bilaterally. Neck:  Without mass or deformity.  Trachea is midline. Lungs: Bilateral diffuse rhonchi and expiratory wheeze without overt rales Heart:  Regular rate and rhythm.  Without murmurs, rubs, or gallops. Abdomen:  Soft, nontender, nondistended.  Without guarding or rebound. Extremities: Without cyanosis, clubbing, edema, or obvious deformity. Vascular:  Dorsalis pedis and posterior tibial pulses palpable bilaterally. Skin:  Warm and dry, no erythema, no ulcerations.  Data Reviewed: I have personally reviewed following labs and imaging studies  CBC:  Recent Labs  Lab 04/06/21 1101 04/07/21 0642  WBC 9.8 11.3*  NEUTROABS 8.1*  --   HGB 13.9 13.1  HCT 41.4 38.8  MCV 94.3 95.3  PLT 263 229   Basic Metabolic Panel: Recent Labs  Lab 04/06/21 1101 04/07/21 0642  NA 138 139  K 3.4* 3.7  CL 103 105  CO2 26 26  GLUCOSE 114* 94  BUN 8 6  CREATININE 0.65 0.53  CALCIUM 9.4 9.1  MG  --  2.2   GFR: Estimated Creatinine Clearance: 82.5 mL/min (by C-G formula based on SCr of 0.53 mg/dL). Liver Function Tests: Recent  Labs  Lab 04/06/21 1101  AST 24  ALT 21  ALKPHOS 102  BILITOT 0.7  PROT 8.2*  ALBUMIN 4.1   No results for input(s): LIPASE, AMYLASE in the last 168 hours. No results for input(s): AMMONIA in the last 168 hours. Coagulation Profile: No results for input(s): INR, PROTIME in the last 168 hours. Cardiac Enzymes: No results for input(s): CKTOTAL, CKMB, CKMBINDEX, TROPONINI in the last 168 hours. BNP (last 3 results) No results for input(s): PROBNP in the last 8760 hours. HbA1C: No results for input(s): HGBA1C in the last 72 hours. CBG: No results for input(s): GLUCAP in the last 168 hours. Lipid Profile: No results for input(s): CHOL, HDL, LDLCALC, TRIG, CHOLHDL, LDLDIRECT in the last 72 hours. Thyroid Function Tests: No results for input(s): TSH, T4TOTAL, FREET4, T3FREE, THYROIDAB in the last 72 hours. Anemia Panel: No results for input(s): VITAMINB12, FOLATE, FERRITIN, TIBC, IRON, RETICCTPCT in the last 72 hours. Sepsis Labs: Recent Labs  Lab 04/06/21 1101  PROCALCITON <0.10  LATICACIDVEN 1.8    Recent Results (from the past 240 hour(s))  Resp Panel by RT-PCR (Flu A&B, Covid) Nasopharyngeal Swab     Status: None   Collection Time: 04/06/21 12:27 PM   Specimen: Nasopharyngeal Swab; Nasopharyngeal(NP) swabs in vial transport medium  Result Value Ref Range Status   SARS Coronavirus 2 by RT PCR NEGATIVE NEGATIVE Final    Comment: (NOTE) SARS-CoV-2 target nucleic acids are NOT DETECTED.  The SARS-CoV-2 RNA is generally detectable in upper respiratory specimens during the acute phase of infection. The lowest concentration of SARS-CoV-2 viral copies this assay can detect is 138 copies/mL. A negative result does not preclude SARS-Cov-2 infection and should not be used as the sole basis for treatment or other patient management decisions. A negative result may occur with  improper specimen collection/handling, submission of specimen other than nasopharyngeal swab, presence of  viral mutation(s) within the areas targeted by this assay, and inadequate number of viral copies(<138 copies/mL). A negative result must be combined with clinical observations, patient history, and epidemiological information. The expected result is Negative.  Fact Sheet for Patients:  BloggerCourse.com  Fact Sheet for Healthcare Providers:  SeriousBroker.it  This test is no t yet approved or cleared by the [place] FDA and  has been authorized for detection and/or diagnosis of SARS-CoV-2 by FDA under an Emergency Use Authorization (EUA). This EUA will remain  in effect (meaning this test can be used) for the duration of the COVID-19 declaration under Section 564(b)(1) of the Act, 21 U.S.C.section 360bbb-3(b)(1), unless the authorization is terminated  or revoked sooner.       Influenza A by PCR NEGATIVE NEGATIVE Final   Influenza B by PCR NEGATIVE NEGATIVE Final    Comment: (NOTE) The Xpert Xpress SARS-CoV-2/FLU/RSV plus assay is intended as an aid in the diagnosis of influenza from Nasopharyngeal swab specimens and  should not be used as a sole basis for treatment. Nasal washings and aspirates are unacceptable for Xpert Xpress SARS-CoV-2/FLU/RSV testing.  Fact Sheet for Patients: BloggerCourse.com  Fact Sheet for Healthcare Providers: SeriousBroker.it  This test is not yet approved or cleared by the [place] FDA and has been authorized for detection and/or diagnosis of SARS-CoV-2 by FDA under an Emergency Use Authorization (EUA). This EUA will remain in effect (meaning this test can be used) for the duration of the COVID-19 declaration under Section 564(b)(1) of the Act, 21 U.S.C. section 360bbb-3(b)(1), unless the authorization is terminated or revoked.  Performed at [facility], [address], [place], [place] [address]          Radiology  Studies: DG Chest 2 View  Result Date: 04/06/2021 CLINICAL DATA:  Cough, hypoxia.  Two week history of cough. EXAM: CHEST - 2 VIEW COMPARISON:  Chest radiograph 12/25/2010 FINDINGS: The heart size and mediastinal contours are within normal limits. Both lungs are clear. No pleural effusion or pneumothorax. Multilevel degenerative changes in the distal thoracic spine. IMPRESSION: No active cardiopulmonary disease. Electronically Signed   By: Sherron Ales M.D.   On: 04/06/2021 12:42   CT Angio Chest PE W and/or Wo Contrast  Result Date: 04/06/2021 CLINICAL DATA:  50 year old female with cough, suspected pulmonary embolism. EXAM: CT ANGIOGRAPHY CHEST WITH CONTRAST TECHNIQUE: Multidetector CT imaging of the chest was performed using the standard protocol during bolus administration of intravenous contrast. Multiplanar CT image reconstructions and MIPs were obtained to evaluate the vascular anatomy. CONTRAST:  Seventy-five mL Omnipaque 350, intravenous COMPARISON:  None. FINDINGS: Cardiovascular: Satisfactory opacification of the pulmonary arteries to the segmental level. No evidence of pulmonary embolism. Normal heart size. No pericardial effusion. Mediastinum/Nodes: No enlarged mediastinal, hilar, or axillary lymph nodes. Thyroid gland, trachea, and esophagus demonstrate no significant findings. Lungs/Pleura: Scattered bilateral, upper lobe predominant geographical foci of consolidative opacification. No pneumothorax or pleural effusion. No suspicious pulmonary nodules. Upper Abdomen: The visualized upper abdomen is within normal limits. Musculoskeletal: No chest wall abnormality. No acute or significant osseous findings. Review of the MIP images confirms the above findings. IMPRESSION: Vascular: No evidence of pulmonary embolism. Non-Vascular: Multifocal pneumonia, predominant in the upper lobes. Marliss Coots, MD [facility] Radiology Electronically Signed   By:  Marliss Coots M.D.   On: 04/06/2021 13:34    Scheduled Meds:  enoxaparin (LOVENOX) injection  40 mg Subcutaneous Q24H   methylPREDNISolone (SOLU-MEDROL) injection  80 mg Intravenous Q24H   Continuous Infusions:  azithromycin Stopped (04/06/21 1720)   cefTRIAXone (ROCEPHIN)  IV Stopped (04/06/21 1545)     LOS: 0 days   Time spent:  Azucena Fallen, DO [facility]  If 7PM-7AM, please contact night-coverage [email or web address]  04/07/2021, 7:26 AM

## 2021-04-08 LAB — BASIC METABOLIC PANEL
Anion gap: 9 (ref 5–15)
BUN: 9 mg/dL (ref 6–20)
CO2: 25 mmol/L (ref 22–32)
Calcium: 9.4 mg/dL (ref 8.9–10.3)
Chloride: 103 mmol/L (ref 98–111)
Creatinine, Ser: 0.67 mg/dL (ref 0.44–1.00)
GFR, Estimated: >60 mL/min (ref >60)
Glucose, Bld: 121 mg/dL — ABNORMAL HIGH (ref 70–99)
Potassium: 3.9 mmol/L (ref 3.5–5.1)
Sodium: 137 mmol/L (ref 135–145)

## 2021-04-08 LAB — CBC
HCT: 39.3 % (ref 36.0–46.0)
Hemoglobin: 13.4 g/dL (ref 12.0–15.0)
MCH: 32 pg (ref 26.0–34.0)
MCHC: 34.1 g/dL (ref 30.0–36.0)
MCV: 93.8 fL (ref 80.0–100.0)
Platelets: 264 10*3/uL (ref 150–400)
RBC: 4.19 MIL/uL (ref 3.87–5.11)
RDW: 12.3 % (ref 11.5–15.5)
WBC: 13.6 10*3/uL — ABNORMAL HIGH (ref 4.0–10.5)
nRBC: 0 % (ref 0.0–0.2)

## 2021-04-08 LAB — PROCALCITONIN: Procalcitonin: 0.29 ng/mL

## 2021-04-08 MED ORDER — DIPHENHYDRAMINE HCL 50 MG/ML IJ SOLN
25.0000 mg | Freq: Three times a day (TID) | INTRAMUSCULAR | Status: DC | PRN
  Administered 2021-04-08: 25 mg via INTRAVENOUS
  Filled 2021-04-08: qty 1

## 2021-04-08 NOTE — Progress Notes (Signed)
PROGRESS NOTE    Nicole Mason  NLZ:[id number] DOB: Nov 23, 1970 DOA: 04/06/2021 PCP: Oneita Hurt, No   Brief Narrative:  Nicole Mason is a 50 y.o. female with medical history significant for chronic low back pain, frequently takes Goody powder, presents emergency department for chief concerns of shortness of breath cough, hypoxia. She reports that the symptoms started about 3 weeks ago.  She states that she was given steroids, inhaler, antibiotics twice per day for seven days at urgent care/pharmacy center.  She states she was never tested for COVID-19 at that time. She reports coughing that started about 1-2 weeks ago after completion of antibiotics and steroids that was prescribed to her from the outpatient urgent care.  She developed difficulty breathing for two days and it worsened today. She reports that ambulation around the house caused her to have difficulty breathing prompting her to present to the ED for further evaluation.   Assessment & Plan:   Acute hypoxic respiratory failure, POA Sepsis secondary to multifocal PNA - Confirmed on imaging; leukocytosis and tachycardia confirm sepsis criteria - Continue azithromycin and ceftriaxone at this time - Solu-Medrol twice daily -wean steroids to p.o. at discharge   Hypokalemia - Repleted, follow repeat labs   Chronic low back pain-extensive counseling to patient regarding cessation of Goody powder - Advised patient to alternate of Goody powder if absolutely needed with acetaminophen - Outpatient pain management follow-up, may benefit from outpatient imaging as well given chronicity of her symptoms  DVT prophylaxis: Lovenox Code Status: Full Family Communication: At bedside  Status is: Inpatient  Dispo: The patient is from: Home              Anticipated d/c is to: Home              Anticipated d/c date is: 48 to 72 hours              Patient currently not medically stable for discharge  Consultants:  None  Procedures:   None  Antimicrobials:  Ceftriaxone, azithromycin  Subjective: No acute issues or events overnight, respiratory status drastically improving over the past 24 hours, not yet back to baseline but certainly improved since admission.  Denies nausea vomiting diarrhea constipation headache fevers chills chest pain  Objective: Vitals:   04/07/21 1617 04/07/21 2109 04/08/21 0004 04/08/21 0413  BP: (!) 149/83 133/79 (!) 146/76 114/83  Pulse: (!) 105 (!) 102 98 98  Resp: 20 20 17 16   Temp: 97.6 F (36.4 C) 98.2 F (36.8 C) 98.8 F (37.1 C) 98.8 F (37.1 C)  TempSrc:   Oral Oral  SpO2:  98% 96% 97%  Weight:      Height:        Intake/Output Summary (Last 24 hours) at 04/08/2021 0713 Last data filed at 04/07/2021 1857 Gross per 24 hour  Intake 753.41 ml  Output --  Net 753.41 ml    Filed Weights   04/06/21 1048 04/07/21 0619  Weight: 80.8 kg 73.3 kg    Examination:  General:  Pleasantly resting in bed, No acute distress. HEENT:  Normocephalic atraumatic.  Sclerae nonicteric, noninjected.  Extraocular movements intact bilaterally. Neck:  Without mass or deformity.  Trachea is midline. Lungs: Bilateral diffuse rhonchi and expiratory wheeze without overt rales Heart:  Regular rate and rhythm.  Without murmurs, rubs, or gallops. Abdomen:  Soft, nontender, nondistended.  Without guarding or rebound. Extremities: Without cyanosis, clubbing, edema, or obvious deformity. Vascular:  Dorsalis pedis and posterior tibial pulses palpable bilaterally.  Skin:  Warm and dry, no erythema, no ulcerations.  Data Reviewed: I have personally reviewed following labs and imaging studies  CBC: Recent Labs  Lab 04/06/21 1101 04/07/21 0642 04/08/21 0425  WBC 9.8 11.3* 13.6*  NEUTROABS 8.1*  --   --   HGB 13.9 13.1 13.4  HCT 41.4 38.8 39.3  MCV 94.3 95.3 93.8  PLT 263 229 264    Basic Metabolic Panel: Recent Labs  Lab 04/06/21 1101 04/07/21 0642 04/08/21 0425  NA 138 139 137  K 3.4*  3.7 3.9  CL 103 105 103  CO2 26 26 25   GLUCOSE 114* 94 121*  BUN 8 6 9   CREATININE 0.65 0.53 0.67  CALCIUM 9.4 9.1 9.4  MG  --  2.2  --     GFR: Estimated Creatinine Clearance: 82.5 mL/min (by C-G formula based on SCr of 0.67 mg/dL). Liver Function Tests: Recent Labs  Lab 04/06/21 1101  AST 24  ALT 21  ALKPHOS 102  BILITOT 0.7  PROT 8.2*  ALBUMIN 4.1    No results for input(s): LIPASE, AMYLASE in the last 168 hours. No results for input(s): AMMONIA in the last 168 hours. Coagulation Profile: No results for input(s): INR, PROTIME in the last 168 hours. Cardiac Enzymes: No results for input(s): CKTOTAL, CKMB, CKMBINDEX, TROPONINI in the last 168 hours. BNP (last 3 results) No results for input(s): PROBNP in the last 8760 hours. HbA1C: No results for input(s): HGBA1C in the last 72 hours. CBG: No results for input(s): GLUCAP in the last 168 hours. Lipid Profile: No results for input(s): CHOL, HDL, LDLCALC, TRIG, CHOLHDL, LDLDIRECT in the last 72 hours. Thyroid Function Tests: No results for input(s): TSH, T4TOTAL, FREET4, T3FREE, THYROIDAB in the last 72 hours. Anemia Panel: No results for input(s): VITAMINB12, FOLATE, FERRITIN, TIBC, IRON, RETICCTPCT in the last 72 hours. Sepsis Labs: Recent Labs  Lab 04/06/21 1101 04/07/21 0642 04/08/21 0425  PROCALCITON <0.10 <0.10 0.29  LATICACIDVEN 1.8  --   --      Recent Results (from the past 240 hour(s))  Resp Panel by RT-PCR (Flu A&B, Covid) Nasopharyngeal Swab     Status: None   Collection Time: 04/06/21 12:27 PM   Specimen: Nasopharyngeal Swab; Nasopharyngeal(NP) swabs in vial transport medium  Result Value Ref Range Status   SARS Coronavirus 2 by RT PCR NEGATIVE NEGATIVE Final    Comment: (NOTE) SARS-CoV-2 target nucleic acids are NOT DETECTED.  The SARS-CoV-2 RNA is generally detectable in upper respiratory specimens during the acute phase of infection. The lowest concentration of SARS-CoV-2 viral copies this  assay can detect is 138 copies/mL. A negative result does not preclude SARS-Cov-2 infection and should not be used as the sole basis for treatment or other patient management decisions. A negative result may occur with  improper specimen collection/handling, submission of specimen other than nasopharyngeal swab, presence of viral mutation(s) within the areas targeted by this assay, and inadequate number of viral copies(<138 copies/mL). A negative result must be combined with clinical observations, patient history, and epidemiological information. The expected result is Negative.  Fact Sheet for Patients:  04/10/21  Fact Sheet for Healthcare Providers:  04/08/21  This test is no t yet approved or cleared by the [employer] FDA and  has been authorized for detection and/or diagnosis of SARS-CoV-2 by FDA under an Emergency Use Authorization (EUA). This EUA will remain  in effect (meaning this test can be used) for the duration of the COVID-19 declaration under Section 564(b)(1) of  the Act, 21 U.S.C.section 360bbb-3(b)(1), unless the authorization is terminated  or revoked sooner.       Influenza A by PCR NEGATIVE NEGATIVE Final   Influenza B by PCR NEGATIVE NEGATIVE Final    Comment: (NOTE) The Xpert Xpress SARS-CoV-2/FLU/RSV plus assay is intended as an aid in the diagnosis of influenza from Nasopharyngeal swab specimens and should not be used as a sole basis for treatment. Nasal washings and aspirates are unacceptable for Xpert Xpress SARS-CoV-2/FLU/RSV testing.  Fact Sheet for Patients: BloggerCourse.comhttps://www.fda.gov/media/152166/download  Fact Sheet for Healthcare Providers: SeriousBroker.ithttps://www.fda.gov/media/152162/download  This test is not yet approved or cleared by the [place] FDA and has been authorized for detection and/or diagnosis of SARS-CoV-2 by FDA under an Emergency Use Authorization (EUA). This EUA will  remain in effect (meaning this test can be used) for the duration of the COVID-19 declaration under Section 564(b)(1) of the Act, 21 U.S.C. section 360bbb-3(b)(1), unless the authorization is terminated or revoked.  Performed at [facility], [address] [place], [place] [address]   Blood culture (routine x 2)     Status: None (Preliminary result)   Collection Time: 04/06/21  2:16 PM   Specimen: BLOOD  Result Value Ref Range Status   Specimen Description BLOOD RIGHT ANTECUBITAL  Final   Special Requests   Final    BOTTLES DRAWN AEROBIC AND ANAEROBIC Blood Culture adequate volume   Culture   Final    NO GROWTH < 24 HOURS Performed at [facility], [address] [place], [place] [address]    Report Status PENDING  Incomplete  Blood culture (routine x 2)     Status: None (Preliminary result)   Collection Time: 04/06/21  3:13 PM   Specimen: BLOOD  Result Value Ref Range Status   Specimen Description BLOOD LEFT AC  Final   Special Requests   Final    BOTTLES DRAWN AEROBIC AND ANAEROBIC Blood Culture adequate volume   Culture   Final    NO GROWTH < 24 HOURS Performed at [facility], [address] [place], [place] [address]    Report Status PENDING  Incomplete          Radiology Studies: DG Chest 2 View  Result Date: 04/06/2021 CLINICAL DATA:  Cough, hypoxia.  Two week history of cough. EXAM: CHEST - 2 VIEW COMPARISON:  Chest radiograph 12/25/2010 FINDINGS: The heart size and mediastinal contours are within normal limits. Both lungs are clear. No pleural effusion or pneumothorax. Multilevel degenerative changes in the distal thoracic spine. IMPRESSION: No active cardiopulmonary disease. Electronically Signed   By: Sherron AlesLaura  Parra M.D.   On: 04/06/2021 12:42   CT Angio Chest PE W and/or Wo Contrast  Result Date: 04/06/2021 CLINICAL DATA:  50 year old female with cough, suspected pulmonary embolism. EXAM: CT ANGIOGRAPHY CHEST WITH CONTRAST  TECHNIQUE: Multidetector CT imaging of the chest was performed using the standard protocol during bolus administration of intravenous contrast. Multiplanar CT image reconstructions and MIPs were obtained to evaluate the vascular anatomy. CONTRAST:  Seventy-five mL Omnipaque 350, intravenous COMPARISON:  None. FINDINGS: Cardiovascular: Satisfactory opacification of the pulmonary arteries to the segmental level. No evidence of pulmonary embolism. Normal heart size. No pericardial effusion. Mediastinum/Nodes: No enlarged mediastinal, hilar, or axillary lymph nodes. Thyroid gland, trachea, and esophagus demonstrate no significant findings. Lungs/Pleura: Scattered bilateral, upper lobe predominant geographical foci of consolidative opacification. No pneumothorax or pleural effusion. No suspicious pulmonary nodules. Upper Abdomen: The visualized upper abdomen is within normal limits. Musculoskeletal: No  chest wall abnormality. No acute or significant osseous findings. Review of the MIP images confirms the above findings. IMPRESSION: Vascular: No evidence of pulmonary embolism. Non-Vascular: Multifocal pneumonia, predominant in the upper lobes. Marliss Coots, MD [facility] Radiology Electronically Signed   By: Marliss Coots M.D.   On: 04/06/2021 13:34    Scheduled Meds:  enoxaparin (LOVENOX) injection  40 mg Subcutaneous Q24H   methylPREDNISolone (SOLU-MEDROL) injection  80 mg Intravenous Q24H   Continuous Infusions:  azithromycin 500 mg (04/07/21 1329)   cefTRIAXone (ROCEPHIN)  IV 2 g (04/07/21 1505)     LOS: 1 day   Time spent:  Azucena Fallen, DO [facility]  If 7PM-7AM, please contact night-coverage [email or web address]  04/08/2021, 7:13 AM

## 2021-04-09 LAB — BASIC METABOLIC PANEL
Anion gap: 8 (ref 5–15)
BUN: 11 mg/dL (ref 6–20)
CO2: 27 mmol/L (ref 22–32)
Calcium: 9 mg/dL (ref 8.9–10.3)
Chloride: 103 mmol/L (ref 98–111)
Creatinine, Ser: 0.67 mg/dL (ref 0.44–1.00)
GFR, Estimated: >60 mL/min (ref >60)
Glucose, Bld: 124 mg/dL — ABNORMAL HIGH (ref 70–99)
Potassium: 3.8 mmol/L (ref 3.5–5.1)
Sodium: 138 mmol/L (ref 135–145)

## 2021-04-09 LAB — CBC
HCT: 37.3 % (ref 36.0–46.0)
Hemoglobin: 12.6 g/dL (ref 12.0–15.0)
MCH: 31 pg (ref 26.0–34.0)
MCHC: 33.8 g/dL (ref 30.0–36.0)
MCV: 91.6 fL (ref 80.0–100.0)
Platelets: 302 10*3/uL (ref 150–400)
RBC: 4.07 MIL/uL (ref 3.87–5.11)
RDW: 12.3 % (ref 11.5–15.5)
WBC: 11.4 10*3/uL — ABNORMAL HIGH (ref 4.0–10.5)
nRBC: 0 % (ref 0.0–0.2)

## 2021-04-09 MED ORDER — ALBUTEROL SULFATE HFA 108 (90 BASE) MCG/ACT IN AERS: 1.0000 | INHALATION_SPRAY | RESPIRATORY_TRACT | Status: DC | PRN

## 2021-04-09 MED ORDER — ALBUTEROL SULFATE (2.5 MG/3ML) 0.083% IN NEBU: 2.5000 mg | INHALATION_SOLUTION | RESPIRATORY_TRACT | Status: DC | PRN

## 2021-04-09 MED ORDER — PREDNISONE 10 MG PO TABS: ORAL_TABLET | ORAL | 0 refills | Status: AC

## 2021-04-09 MED ORDER — AMOXICILLIN-POT CLAVULANATE 875-125 MG PO TABS: 1.0000 | ORAL_TABLET | Freq: Two times a day (BID) | ORAL | 0 refills | Status: AC

## 2021-04-09 MED ORDER — METHYLPREDNISOLONE SODIUM SUCC 40 MG IJ SOLR
40.0000 mg | Freq: Two times a day (BID) | INTRAMUSCULAR | Status: DC
  Administered 2021-04-09: 40 mg via INTRAVENOUS
  Filled 2021-04-09: qty 1

## 2021-04-09 NOTE — Progress Notes (Signed)
SATURATION QUALIFICATIONS: (This note is used to comply with regulatory documentation for home oxygen)  Patient Saturations on Room Air at Rest = 94%  Patient Saturations on Room Air while Ambulating =89 %  Patient Saturations on 0 Liters of oxygen while Ambulating = 89%  Please briefly explain why patient needs home oxygen: 

## 2021-04-09 NOTE — Discharge Summary (Signed)
Physician Discharge Summary  Nicole Mason YOV:[id number] DOB: May 12, 1971 DOA: 04/06/2021  PCP: Pcp, No  Admit date: 04/06/2021 Discharge date: 04/09/2021  Admitted From: Home Disposition: Home  Recommendations for Outpatient Follow-up:  Follow up with PCP in 1-2 weeks Please obtain BMP/CBC in one week  Home Health: None Equipment/Devices: None  Discharge Condition: Stable CODE STATUS: Full Diet recommendation:    Brief/Interim Summary: Nicole Mason is a 50 y.o. female with medical history significant for chronic low back pain, frequently takes Goody powder, presents emergency department for chief concerns of shortness of breath cough, hypoxia. She reports that the symptoms started about 3 weeks ago.  She states that she was given steroids, inhaler, antibiotics twice per day for seven days at urgent care/pharmacy center.  She states she was never tested for COVID-19 at that time. She reports coughing that started about 1-2 weeks ago after completion of antibiotics and steroids that was prescribed to her from the outpatient urgent care.  She developed difficulty breathing for two days and it worsened today. She reports that ambulation around the house caused her to have difficulty breathing prompting her to present to the ED for further evaluation.   Assessment & Plan:   Acute hypoxic respiratory failure, POA Sepsis secondary to multifocal PNA - Confirmed on imaging; leukocytosis and tachycardia confirm sepsis criteria -Transition to Augmentin for p.o. antibiotics at discharge -Transition to p.o. prednisone taper at discharge -No longer requiring oxygen supplementation, clinically appears to be approaching baseline, ambulated today without dyspnea or hypoxia   Hypokalemia - Repleted, follow repeat labs   Chronic low back pain-extensive counseling to patient regarding cessation of Goody powder - Advised patient to alternate of Goody powder if absolutely needed with  acetaminophen - Outpatient pain management follow-up, may benefit from outpatient imaging as well given chronicity of her symptoms   Discharge Instructions  Discharge Instructions     Diet - low sodium heart healthy   Complete by: As directed    Increase activity slowly   Complete by: As directed       Allergies as of 04/09/2021       Reactions   Oxycodone-acetaminophen    Other reaction(s): Other (See Comments)        Medication List     STOP taking these medications    Goodys Extra Strength 500-325-65 MG Pack Generic drug: Aspirin-Acetaminophen-Caffeine       TAKE these medications    albuterol 108 (90 Base) MCG/ACT inhaler Commonly known as: VENTOLIN HFA Inhale 2 puffs into the lungs every 4 (four) hours as needed for shortness of breath or wheezing.   amoxicillin-clavulanate 875-125 MG tablet Commonly known as: Augmentin Take 1 tablet by mouth 2 (two) times daily for 3 days.   predniSONE 10 MG tablet Commonly known as: DELTASONE Take 4 tablets (40 mg total) by mouth daily for 3 days, THEN 3 tablets (30 mg total) daily for 3 days, THEN 2 tablets (20 mg total) daily for 3 days, THEN 1 tablet (10 mg total) daily for 3 days. Start taking on: April 09, 2021        Allergies  Allergen Reactions   Oxycodone-Acetaminophen     Other reaction(s): Other (See Comments)    Consultations: None  Procedures/Studies: DG Chest 2 View  Result Date: 04/06/2021 CLINICAL DATA:  Cough, hypoxia.  Two week history of cough. EXAM: CHEST - 2 VIEW COMPARISON:  Chest radiograph 12/25/2010 FINDINGS: The heart size and mediastinal contours are within normal limits. Both lungs  are clear. No pleural effusion or pneumothorax. Multilevel degenerative changes in the distal thoracic spine. IMPRESSION: No active cardiopulmonary disease. Electronically Signed   By: Sherron Ales M.D.   On: 04/06/2021 12:42   CT Angio Chest PE W and/or Wo Contrast  Result Date: 04/06/2021 CLINICAL  DATA:  50 year old female with cough, suspected pulmonary embolism. EXAM: CT ANGIOGRAPHY CHEST WITH CONTRAST TECHNIQUE: Multidetector CT imaging of the chest was performed using the standard protocol during bolus administration of intravenous contrast. Multiplanar CT image reconstructions and MIPs were obtained to evaluate the vascular anatomy. CONTRAST:  Seventy-five mL Omnipaque 350, intravenous COMPARISON:  None. FINDINGS: Cardiovascular: Satisfactory opacification of the pulmonary arteries to the segmental level. No evidence of pulmonary embolism. Normal heart size. No pericardial effusion. Mediastinum/Nodes: No enlarged mediastinal, hilar, or axillary lymph nodes. Thyroid gland, trachea, and esophagus demonstrate no significant findings. Lungs/Pleura: Scattered bilateral, upper lobe predominant geographical foci of consolidative opacification. No pneumothorax or pleural effusion. No suspicious pulmonary nodules. Upper Abdomen: The visualized upper abdomen is within normal limits. Musculoskeletal: No chest wall abnormality. No acute or significant osseous findings. Review of the MIP images confirms the above findings. IMPRESSION: Vascular: No evidence of pulmonary embolism. Non-Vascular: Multifocal pneumonia, predominant in the upper lobes. Marliss Coots, MD [facility] Electronically Signed   By: Marliss Coots M.D.   On: 04/06/2021 13:34     Subjective: No acute issues or events overnight respiratory status improving drastically over the past 24 hours denies nausea vomiting diarrhea constipation headache fevers chills chest pain   Discharge Exam: Vitals:   04/09/21 0430 04/09/21 0804  BP: 120/73 108/85  Pulse: 88 97  Resp:    Temp: 98.1 F (36.7 C)   SpO2: 97% 95%   Vitals:   04/08/21 1540 04/08/21 2053 04/09/21 0430 04/09/21 0804  BP: 121/89 (!) 141/82 120/73 108/85  Pulse: (!) 101 95 88 97  Resp: 18     Temp: 98.7 F (37.1 C) 98.2  F (36.8 C) 98.1 F (36.7 C)   TempSrc:      SpO2: 98% 100% 97% 95%  Weight:      Height:        General: Pt is alert, awake, not in acute distress Cardiovascular: RRR, S1/S2 +, no rubs, no gallops Respiratory: Bilateral diffuse rhonchi without overt wheeze or rales Abdominal: Soft, NT, ND, bowel sounds + Extremities: no edema, no cyanosis    The results of significant diagnostics from this hospitalization (including imaging, microbiology, ancillary and laboratory) are listed below for reference.     Microbiology: Recent Results (from the past 240 hour(s))  Resp Panel by RT-PCR (Flu A&B, Covid) Nasopharyngeal Swab     Status: None   Collection Time: 04/06/21 12:27 PM   Specimen: Nasopharyngeal Swab; Nasopharyngeal(NP) swabs in vial transport medium  Result Value Ref Range Status   SARS Coronavirus 2 by RT PCR NEGATIVE NEGATIVE Final    Comment: (NOTE) SARS-CoV-2 target nucleic acids are NOT DETECTED.  The SARS-CoV-2 RNA is generally detectable in upper respiratory specimens during the acute phase of infection. The lowest concentration of SARS-CoV-2 viral copies this assay can detect is 138 copies/mL. A negative result does not preclude SARS-Cov-2 infection and should not be used as the sole basis for treatment or other patient management decisions. A negative result may occur with  improper specimen collection/handling, submission of specimen other than nasopharyngeal swab, presence of viral mutation(s) within the areas targeted by this assay, and inadequate number of viral  copies(<138 copies/mL). A negative result must be combined with clinical observations, patient history, and epidemiological information. The expected result is Negative.  Fact Sheet for Patients:  BloggerCourse.comhttps://www.fda.gov/media/152166/download  Fact Sheet for Healthcare Providers:  SeriousBroker.ithttps://www.fda.gov/media/152162/download  This test is no t yet approved or cleared by the [place] FDA and  has  been authorized for detection and/or diagnosis of SARS-CoV-2 by FDA under an Emergency Use Authorization (EUA). This EUA will remain  in effect (meaning this test can be used) for the duration of the COVID-19 declaration under Section 564(b)(1) of the Act, 21 U.S.C.section 360bbb-3(b)(1), unless the authorization is terminated  or revoked sooner.       Influenza A by PCR NEGATIVE NEGATIVE Final   Influenza B by PCR NEGATIVE NEGATIVE Final    Comment: (NOTE) The Xpert Xpress SARS-CoV-2/FLU/RSV plus assay is intended as an aid in the diagnosis of influenza from Nasopharyngeal swab specimens and should not be used as a sole basis for treatment. Nasal washings and aspirates are unacceptable for Xpert Xpress SARS-CoV-2/FLU/RSV testing.  Fact Sheet for Patients: BloggerCourse.comhttps://www.fda.gov/media/152166/download  Fact Sheet for Healthcare Providers: SeriousBroker.ithttps://www.fda.gov/media/152162/download  This test is not yet approved or cleared by the [place] FDA and has been authorized for detection and/or diagnosis of SARS-CoV-2 by FDA under an Emergency Use Authorization (EUA). This EUA will remain in effect (meaning this test can be used) for the duration of the COVID-19 declaration under Section 564(b)(1) of the Act, 21 U.S.C. section 360bbb-3(b)(1), unless the authorization is terminated or revoked.  Performed at [facility], [address] [place], [place] [address]   Blood culture (routine x 2)     Status: None (Preliminary result)   Collection Time: 04/06/21  2:16 PM   Specimen: BLOOD  Result Value Ref Range Status   Specimen Description BLOOD RIGHT ANTECUBITAL  Final   Special Requests   Final    BOTTLES DRAWN AEROBIC AND ANAEROBIC Blood Culture adequate volume   Culture   Final    NO GROWTH 3 DAYS Performed at [facility], [address] [place], [place] [address]    Report Status PENDING  Incomplete  Blood culture (routine x 2)     Status: None  (Preliminary result)   Collection Time: 04/06/21  3:13 PM   Specimen: BLOOD  Result Value Ref Range Status   Specimen Description BLOOD LEFT AC  Final   Special Requests   Final    BOTTLES DRAWN AEROBIC AND ANAEROBIC Blood Culture adequate volume   Culture   Final    NO GROWTH 3 DAYS Performed at [facility], [address] [place], [place] [address]    Report Status PENDING  Incomplete     Labs: BNP (last 3 results) Recent Labs    04/06/21 1101  BNP 34.6   Basic Metabolic Panel: Recent Labs  Lab 04/06/21 1101 04/07/21 0642 04/08/21 0425 04/09/21 0415  NA 138 139 137 138  K 3.4* 3.7 3.9 3.8  CL 103 105 103 103  CO2 26 26 25 27   GLUCOSE 114* 94 121* 124*  BUN 8 6 9 11   CREATININE 0.65 0.53 0.67 0.67  CALCIUM 9.4 9.1 9.4 9.0  MG  --  2.2  --   --    Liver Function Tests: Recent Labs  Lab 04/06/21 1101  AST 24  ALT 21  ALKPHOS 102  BILITOT 0.7  PROT 8.2*  ALBUMIN 4.1   No results for input(s): LIPASE, AMYLASE in the last 168 hours. No results  for input(s): AMMONIA in the last 168 hours. CBC: Recent Labs  Lab 04/06/21 1101 04/07/21 0642 04/08/21 0425 04/09/21 0415  WBC 9.8 11.3* 13.6* 11.4*  NEUTROABS 8.1*  --   --   --   HGB 13.9 13.1 13.4 12.6  HCT 41.4 38.8 39.3 37.3  MCV 94.3 95.3 93.8 91.6  PLT 263 229 264 302   Cardiac Enzymes: No results for input(s): CKTOTAL, CKMB, CKMBINDEX, TROPONINI in the last 168 hours. BNP: Invalid input(s): POCBNP CBG: No results for input(s): GLUCAP in the last 168 hours. D-Dimer No results for input(s): DDIMER in the last 72 hours. Hgb A1c No results for input(s): HGBA1C in the last 72 hours. Lipid Profile No results for input(s): CHOL, HDL, LDLCALC, TRIG, CHOLHDL, LDLDIRECT in the last 72 hours. Thyroid function studies No results for input(s): TSH, T4TOTAL, T3FREE, THYROIDAB in the last 72 hours.  Invalid input(s): FREET3 Anemia work up No results for input(s): VITAMINB12, FOLATE, FERRITIN,  TIBC, IRON, RETICCTPCT in the last 72 hours. Urinalysis    Component Value Date/Time   COLORURINE YELLOW (A) 04/06/2021 1518   APPEARANCEUR CLEAR (A) 04/06/2021 1518   LABSPEC >1.046 (H) 04/06/2021 1518   PHURINE 5.0 04/06/2021 1518   GLUCOSEU NEGATIVE 04/06/2021 1518   HGBUR MODERATE (A) 04/06/2021 1518   BILIRUBINUR NEGATIVE 04/06/2021 1518   KETONESUR 5 (A) 04/06/2021 1518   PROTEINUR NEGATIVE 04/06/2021 1518   NITRITE NEGATIVE 04/06/2021 1518   LEUKOCYTESUR NEGATIVE 04/06/2021 1518   Sepsis Labs Invalid input(s): PROCALCITONIN,  WBC,  LACTICIDVEN Microbiology Recent Results (from the past 240 hour(s))  Resp Panel by RT-PCR (Flu A&B, Covid) Nasopharyngeal Swab     Status: None   Collection Time: 04/06/21 12:27 PM   Specimen: Nasopharyngeal Swab; Nasopharyngeal(NP) swabs in vial transport medium  Result Value Ref Range Status   SARS Coronavirus 2 by RT PCR NEGATIVE NEGATIVE Final    Comment: (NOTE) SARS-CoV-2 target nucleic acids are NOT DETECTED.  The SARS-CoV-2 RNA is generally detectable in upper respiratory specimens during the acute phase of infection. The lowest concentration of SARS-CoV-2 viral copies this assay can detect is 138 copies/mL. A negative result does not preclude SARS-Cov-2 infection and should not be used as the sole basis for treatment or other patient management decisions. A negative result may occur with  improper specimen collection/handling, submission of specimen other than nasopharyngeal swab, presence of viral mutation(s) within the areas targeted by this assay, and inadequate number of viral copies(<138 copies/mL). A negative result must be combined with clinical observations, patient history, and epidemiological information. The expected result is Negative.  Fact Sheet for Patients:  BloggerCourse.com  Fact Sheet for Healthcare Providers:  SeriousBroker.it  This test is no t yet approved  or cleared by the [place] FDA and  has been authorized for detection and/or diagnosis of SARS-CoV-2 by FDA under an Emergency Use Authorization (EUA). This EUA will remain  in effect (meaning this test can be used) for the duration of the COVID-19 declaration under Section 564(b)(1) of the Act, 21 U.S.C.section 360bbb-3(b)(1), unless the authorization is terminated  or revoked sooner.       Influenza A by PCR NEGATIVE NEGATIVE Final   Influenza B by PCR NEGATIVE NEGATIVE Final    Comment: (NOTE) The Xpert Xpress SARS-CoV-2/FLU/RSV plus assay is intended as an aid in the diagnosis of influenza from Nasopharyngeal swab specimens and should not be used as a sole basis for treatment. Nasal washings and aspirates are unacceptable for Xpert Xpress SARS-CoV-2/FLU/RSV testing.  Fact Sheet for Patients: BloggerCourse.com  Fact Sheet for Healthcare Providers: SeriousBroker.it  This test is not yet approved or cleared by the [place] FDA and has been authorized for detection and/or diagnosis of SARS-CoV-2 by FDA under an Emergency Use Authorization (EUA). This EUA will remain in effect (meaning this test can be used) for the duration of the COVID-19 declaration under Section 564(b)(1) of the Act, 21 U.S.C. section 360bbb-3(b)(1), unless the authorization is terminated or revoked.  Performed at [facility], [address] [place], [place] [address]   Blood culture (routine x 2)     Status: None (Preliminary result)   Collection Time: 04/06/21  2:16 PM   Specimen: BLOOD  Result Value Ref Range Status   Specimen Description BLOOD RIGHT ANTECUBITAL  Final   Special Requests   Final    BOTTLES DRAWN AEROBIC AND ANAEROBIC Blood Culture adequate volume   Culture   Final    NO GROWTH 3 DAYS Performed at [facility], [address], [place], [place] [address]    Report Status PENDING  Incomplete  Blood  culture (routine x 2)     Status: None (Preliminary result)   Collection Time: 04/06/21  3:13 PM   Specimen: BLOOD  Result Value Ref Range Status   Specimen Description BLOOD LEFT [facility]  Final   Special Requests   Final    BOTTLES DRAWN AEROBIC AND ANAEROBIC Blood Culture adequate volume   Culture   Final    NO GROWTH 3 DAYS Performed at [facility], [address] [place], [place] [address]    Report Status PENDING  Incomplete   Time coordinating discharge: Over 30 minutes  SIGNED:  Azucena Fallen, DO [facility] 04/09/2021, 11:17 AM Pager   If 7PM-7AM, please contact night-coverage [email or web address]

## 2021-04-09 NOTE — Progress Notes (Signed)
IV removed before discharge. Went over discharge instructions with patient. Patient stated that she understood and all questions were answered. Patient went home POV with husband.

## 2021-04-11 LAB — CULTURE, BLOOD (ROUTINE X 2)
Culture: NO GROWTH
Culture: NO GROWTH
Special Requests: ADEQUATE
Special Requests: ADEQUATE

## 2022-04-18 ENCOUNTER — Other Ambulatory Visit: Payer: Self-pay

## 2022-04-18 DIAGNOSIS — Z87891 Personal history of nicotine dependence: Secondary | ICD-10-CM

## 2022-04-18 DIAGNOSIS — Z122 Encounter for screening for malignant neoplasm of respiratory organs: Secondary | ICD-10-CM

## 2022-04-18 DIAGNOSIS — F17210 Nicotine dependence, cigarettes, uncomplicated: Secondary | ICD-10-CM

## 2022-05-15 ENCOUNTER — Ambulatory Visit (INDEPENDENT_AMBULATORY_CARE_PROVIDER_SITE_OTHER): Payer: BC Managed Care – PPO | Admitting: Acute Care

## 2022-05-15 ENCOUNTER — Encounter: Payer: Self-pay | Admitting: Acute Care

## 2022-05-15 DIAGNOSIS — F17210 Nicotine dependence, cigarettes, uncomplicated: Secondary | ICD-10-CM

## 2022-05-15 NOTE — Patient Instructions (Signed)
Thank you for participating in the [facility]. It was our pleasure to meet you today. We will call you with the results of your scan within the next few days. Your scan will be assigned a Lung RADS category score by the physicians reading the scans.  This Lung RADS score determines follow up scanning.  See below for description of categories, and follow up screening recommendations. We will be in touch to schedule your follow up screening annually or based on recommendations of our providers. We will fax a copy of your scan results to your Primary Care Physician, or the physician who referred you to the program, to ensure they have the results. Please call the office if you have any questions or concerns regarding your scanning experience or results.  Our office number is [phone]. Please speak with Denise Phelps, RN. , or  Denise Buckner RN, They are  our Lung Cancer Screening RN.'s If They are unavailable when you call, Please leave a message on the voice mail. We will return your call at our earliest convenience.This voice mail is monitored several times a day.  Remember, if your scan is normal, we will scan you annually as long as you continue to meet the criteria for the program. (Age 55-77, Current smoker or smoker who has quit within the last 15 years). If you are a smoker, remember, quitting is the single most powerful action that you can take to decrease your risk of lung cancer and other pulmonary, breathing related problems. We know quitting is hard, and we are here to help.  Please let us know if there is anything we can do to help you meet your goal of quitting. If you are a former smoker, congratulations. We are proud of you! Remain smoke free! Remember you can refer friends or family members through the number above.  We will screen them to make sure they meet criteria for the program. Thank you for helping us take better care of you by  participating in Lung Screening.  You can receive free nicotine replacement therapy ( patches, gum or mints) by calling 1-800-QUIT NOW. Please call so we can get you on the path to becoming  a non-smoker. I know it is hard, but you can do this!  Lung RADS Categories:  Lung RADS 1: no nodules or definitely non-concerning nodules.  Recommendation is for a repeat annual scan in 12 months.  Lung RADS 2:  nodules that are non-concerning in appearance and behavior with a very low likelihood of becoming an active cancer. Recommendation is for a repeat annual scan in 12 months.  Lung RADS 3: nodules that are probably non-concerning , includes nodules with a low likelihood of becoming an active cancer.  Recommendation is for a 6-month repeat screening scan. Often noted after an upper respiratory illness. We will be in touch to make sure you have no questions, and to schedule your 6-month scan.  Lung RADS 4 A: nodules with concerning findings, recommendation is most often for a follow up scan in 3 months or additional testing based on our provider's assessment of the scan. We will be in touch to make sure you have no questions and to schedule the recommended 3 month follow up scan.  Lung RADS 4 B:  indicates findings that are concerning. We will be in touch with you to schedule additional diagnostic testing based on our provider's  assessment of the scan.  Other options for assistance in smoking cessation (   As covered by your insurance benefits)  Hypnosis for smoking cessation  [employer] [phone]  Acupuncture for smoking cessation  [facility] [phone]   

## 2022-05-15 NOTE — Progress Notes (Signed)
Virtual Visit via Telephone Note  I connected with Nicole Mason on 05/15/22 at  9:00 AM EDT by telephone and verified that I am speaking with the correct person using two identifiers.  Location: Patient:  At home Provider:  [address], [place], [place], [address]    I discussed the limitations, risks, security and privacy concerns of performing an evaluation and management service by telephone and the availability of in person appointments. I also discussed with the patient that there may be a patient responsible charge related to this service. The patient expressed understanding and agreed to proceed.    Shared Decision Making Visit Lung Cancer Screening Program [phone])   Eligibility: Age 51 y.o. Pack Years Smoking History Calculation 20 pack year smoking history (# packs/per year x # years smoked) Recent History of coughing up blood  no Unexplained weight loss? no ( >Than 15 pounds within the last 6 months ) Prior History Lung / other cancer no (Diagnosis within the last 5 years already requiring surveillance chest CT Scans). Smoking Status Current Smoker Former Smokers: Years since quit:  NA  Quit Date:  NA  Visit Components: Discussion included one or more decision making aids. yes Discussion included risk/benefits of screening. yes Discussion included potential follow up diagnostic testing for abnormal scans. yes Discussion included meaning and risk of over diagnosis. yes Discussion included meaning and risk of False Positives. yes Discussion included meaning of total radiation exposure. yes  Counseling Included: Importance of adherence to annual lung cancer LDCT screening. yes Impact of comorbidities on ability to participate in the program. yes Ability and willingness to under diagnostic treatment. yes  Smoking Cessation Counseling: Current Smokers:  Discussed importance of smoking cessation. yes Information about tobacco cessation classes and  interventions provided to patient. yes Patient provided with "ticket" for LDCT Scan. yes Symptomatic Patient. no  Counseling NA Diagnosis Code: Tobacco Use Z72.0 Asymptomatic Patient yes  Counseling (Intermediate counseling: > three minutes counseling) E3329 Former Smokers:  Discussed the importance of maintaining cigarette abstinence. yes Diagnosis Code: Personal History of Nicotine Dependence. J18.841 Information about tobacco cessation classes and interventions provided to patient. Yes Patient provided with "ticket" for LDCT Scan. yes Written Order for Lung Cancer Screening with LDCT placed in Epic. Yes (CT Chest Lung Cancer Screening Low Dose W/O CM) [id number] Z12.2-Screening of respiratory organs Z87.891-Personal history of nicotine dependence  Pt. Has set quit date of tomorrow 05/16/2022. She has a friend she is quitting with.   I have spent 25 minutes of face to face/ virtual visit   time with Nicole Mason discussing the risks and benefits of lung cancer screening. We viewed / discussed a power point together that explained in detail the above noted topics. We paused at intervals to allow for questions to be asked and answered to ensure understanding.We discussed that the single most powerful action that she can take to decrease her risk of developing lung cancer is to quit smoking. We discussed whether or not she is ready to commit to setting a quit date. We discussed options for tools to aid in quitting smoking including nicotine replacement therapy, non-nicotine medications, support groups, Quit Smart classes, and behavior modification. We discussed that often times setting smaller, more achievable goals, such as eliminating 1 cigarette a day for a week and then 2 cigarettes a day for a week can be helpful in slowly decreasing the number of cigarettes smoked. This allows for a sense of accomplishment as well as providing a clinical  benefit. I provided  her  with smoking cessation  information   with contact information for community resources, classes, free nicotine replacement therapy, and access to mobile apps, text messaging, and on-line smoking cessation help. I have also provided  her  the office contact information in the event she needs to contact me, or the screening staff. We discussed the time and location of the scan, and that either Nicole Glassman RN, Nicole Prince, RN  or I will call / send a letter with the results within 24-72 hours of receiving them. The patient verbalized understanding of all of  the above and had no further questions upon leaving the office. They have my contact information in the event they have any further questions.  I spent 3 minutes counseling on smoking cessation and the health risks of continued tobacco abuse.  I explained to the patient that there has been a high incidence of coronary artery disease noted on these exams. I explained that this is a non-gated exam therefore degree or severity cannot be determined. This patient is not on statin therapy. I have asked the patient to follow-up with their PCP regarding any incidental finding of coronary artery disease and management with diet or medication as their PCP  feels is clinically indicated. The patient verbalized understanding of the above and had no further questions upon completion of the visit.      Magdalen Spatz, NP 05/15/2022

## 2022-05-16 ENCOUNTER — Ambulatory Visit: Admission: RE | Admit: 2022-05-16 | Payer: BC Managed Care – PPO | Source: Ambulatory Visit

## 2022-05-22 ENCOUNTER — Ambulatory Visit
Admission: RE | Admit: 2022-05-22 | Discharge: 2022-05-22 | Disposition: A | Discharge status: Home / Self Care | Payer: BC Managed Care – PPO | Source: Ambulatory Visit | Attending: Acute Care | Admitting: Acute Care

## 2022-05-22 DIAGNOSIS — Z87891 Personal history of nicotine dependence: Secondary | ICD-10-CM | POA: Insufficient documentation

## 2022-05-22 DIAGNOSIS — F17210 Nicotine dependence, cigarettes, uncomplicated: Secondary | ICD-10-CM | POA: Diagnosis present

## 2022-05-22 DIAGNOSIS — Z122 Encounter for screening for malignant neoplasm of respiratory organs: Secondary | ICD-10-CM | POA: Diagnosis not present

## 2022-05-23 ENCOUNTER — Telehealth: Payer: Self-pay | Admitting: Acute Care

## 2022-05-23 NOTE — Telephone Encounter (Signed)
Received call report from [place] with [facility] on patient's lung cancer screen CT done on 05/22/22. Sarah, please review the result/impression copied below:  IMPRESSION: 1. Lung-RADS 2-S, benign appearance or behavior. Continue annual screening with low-dose chest CT without contrast in 12 months. 2. The "S" modifier above refers to potentially clinically significant non lung cancer related findings. Specifically, newly mildly enlarged right axillary lymph node, nonspecific. Suggest diagnostic mammographic evaluation at this time. 3. Small hiatal hernia. 4. Aortic Atherosclerosis (ICD10-I70.0) and Emphysema (ICD10-J43.9).    Please advise, thank you.  Also routing to lung nodule pool.

## 2022-05-24 ENCOUNTER — Telehealth: Payer: Self-pay | Admitting: Acute Care

## 2022-05-24 DIAGNOSIS — F17210 Nicotine dependence, cigarettes, uncomplicated: Secondary | ICD-10-CM

## 2022-05-24 DIAGNOSIS — Z122 Encounter for screening for malignant neoplasm of respiratory organs: Secondary | ICD-10-CM

## 2022-05-24 DIAGNOSIS — Z87891 Personal history of nicotine dependence: Secondary | ICD-10-CM

## 2022-05-24 NOTE — Telephone Encounter (Signed)
See phone note 05/24/22

## 2022-05-24 NOTE — Telephone Encounter (Signed)
Spoke with patient by phone to, using two patient identifiers, to review results of LDCT.  No suspicious findings for lung cancer.  Atherosclerosis and emphysema noted, as well as, small hiatal hernia.  Patient is not on cholesterol medication but will discuss with PCP.  Mildly enlarged lymph node right axilla.  Patient states she has noticed it looks a little larger.  She states she just had a mammogram.  Mammogram is ordered by her PCP and she will also discuss this with her.  Patient requests results be mailed to her home.  Address verified.  Will also send fax to PCP with results and reference to above discussion. Must send manual fax, as unable to send via Epic.  Patient had no further questions and acknowledged understanding. Order placed for 2024 annual LDCT

## 2022-06-02 ENCOUNTER — Emergency Department: Payer: BC Managed Care – PPO

## 2022-06-02 ENCOUNTER — Other Ambulatory Visit: Payer: Self-pay

## 2022-06-02 ENCOUNTER — Inpatient Hospital Stay
Admission: EM | Admit: 2022-06-02 | Discharge: 2022-06-04 | DRG: 190 | Disposition: A | Discharge status: Home / Self Care | Payer: BC Managed Care – PPO | Attending: Internal Medicine | Admitting: Internal Medicine

## 2022-06-02 DIAGNOSIS — K219 Gastro-esophageal reflux disease without esophagitis: Secondary | ICD-10-CM

## 2022-06-02 DIAGNOSIS — Z1152 Encounter for screening for COVID-19: Secondary | ICD-10-CM

## 2022-06-02 DIAGNOSIS — J45901 Unspecified asthma with (acute) exacerbation: Secondary | ICD-10-CM | POA: Diagnosis present

## 2022-06-02 DIAGNOSIS — D72829 Elevated white blood cell count, unspecified: Secondary | ICD-10-CM | POA: Diagnosis present

## 2022-06-02 DIAGNOSIS — R079 Chest pain, unspecified: Secondary | ICD-10-CM

## 2022-06-02 DIAGNOSIS — J9601 Acute respiratory failure with hypoxia: Secondary | ICD-10-CM | POA: Diagnosis present

## 2022-06-02 DIAGNOSIS — I517 Cardiomegaly: Secondary | ICD-10-CM | POA: Diagnosis present

## 2022-06-02 DIAGNOSIS — J441 Chronic obstructive pulmonary disease with (acute) exacerbation: Secondary | ICD-10-CM | POA: Diagnosis present

## 2022-06-02 DIAGNOSIS — F32A Depression, unspecified: Secondary | ICD-10-CM

## 2022-06-02 DIAGNOSIS — Z885 Allergy status to narcotic agent status: Secondary | ICD-10-CM | POA: Diagnosis not present

## 2022-06-02 DIAGNOSIS — Z803 Family history of malignant neoplasm of breast: Secondary | ICD-10-CM | POA: Diagnosis not present

## 2022-06-02 DIAGNOSIS — Z87891 Personal history of nicotine dependence: Secondary | ICD-10-CM | POA: Diagnosis not present

## 2022-06-02 HISTORY — DX: Unspecified asthma, uncomplicated: J45.909

## 2022-06-02 HISTORY — DX: Chronic obstructive pulmonary disease, unspecified: J44.9

## 2022-06-02 LAB — TROPONIN I (HIGH SENSITIVITY)
Troponin I (High Sensitivity): 8 ng/L (ref <18)
Troponin I (High Sensitivity): 9 ng/L (ref <18)
Troponin I (High Sensitivity): 9 ng/L (ref <18)

## 2022-06-02 LAB — CBC WITH DIFFERENTIAL/PLATELET
Abs Immature Granulocytes: 0.05 10*3/uL (ref 0.00–0.07)
Basophils Absolute: 0.1 10*3/uL (ref 0.0–0.1)
Basophils Relative: 1 %
Eosinophils Absolute: 0.5 10*3/uL (ref 0.0–0.5)
Eosinophils Relative: 4 %
HCT: 45.8 % (ref 36.0–46.0)
Hemoglobin: 14.6 g/dL (ref 12.0–15.0)
Immature Granulocytes: 0 %
Lymphocytes Relative: 22 %
Lymphs Abs: 3 10*3/uL (ref 0.7–4.0)
MCH: 29.4 pg (ref 26.0–34.0)
MCHC: 31.9 g/dL (ref 30.0–36.0)
MCV: 92.3 fL (ref 80.0–100.0)
Monocytes Absolute: 0.8 10*3/uL (ref 0.1–1.0)
Monocytes Relative: 6 %
Neutro Abs: 9.3 10*3/uL — ABNORMAL HIGH (ref 1.7–7.7)
Neutrophils Relative %: 67 %
Platelets: 309 10*3/uL (ref 150–400)
RBC: 4.96 MIL/uL (ref 3.87–5.11)
RDW: 13.6 % (ref 11.5–15.5)
WBC: 13.7 10*3/uL — ABNORMAL HIGH (ref 4.0–10.5)
nRBC: 0 % (ref 0.0–0.2)

## 2022-06-02 LAB — RESP PANEL BY RT-PCR (FLU A&B, COVID) ARPGX2
Influenza A by PCR: NEGATIVE
Influenza B by PCR: NEGATIVE
SARS Coronavirus 2 by RT PCR: NEGATIVE

## 2022-06-02 LAB — COMPREHENSIVE METABOLIC PANEL
ALT: 13 U/L (ref 0–44)
AST: 17 U/L (ref 15–41)
Albumin: 4.2 g/dL (ref 3.5–5.0)
Alkaline Phosphatase: 156 U/L — ABNORMAL HIGH (ref 38–126)
Anion gap: 7 (ref 5–15)
BUN: 10 mg/dL (ref 6–20)
CO2: 26 mmol/L (ref 22–32)
Calcium: 9.5 mg/dL (ref 8.9–10.3)
Chloride: 104 mmol/L (ref 98–111)
Creatinine, Ser: 0.75 mg/dL (ref 0.44–1.00)
GFR, Estimated: >60 mL/min (ref >60)
Glucose, Bld: 184 mg/dL — ABNORMAL HIGH (ref 70–99)
Potassium: 3.9 mmol/L (ref 3.5–5.1)
Sodium: 137 mmol/L (ref 135–145)
Total Bilirubin: 0.8 mg/dL (ref 0.3–1.2)
Total Protein: 8.5 g/dL — ABNORMAL HIGH (ref 6.5–8.1)

## 2022-06-02 LAB — BRAIN NATRIURETIC PEPTIDE: B Natriuretic Peptide: 34.4 pg/mL (ref 0.0–100.0)

## 2022-06-02 LAB — LACTIC ACID, PLASMA
Lactic Acid, Venous: 1.9 mmol/L (ref 0.5–1.9)
Lactic Acid, Venous: 1.3 mmol/L (ref 0.5–1.9)

## 2022-06-02 LAB — D-DIMER, QUANTITATIVE: D-Dimer, Quant: 0.36 ug/mL-FEU (ref 0.00–0.50)

## 2022-06-02 MED ORDER — ONDANSETRON HCL 4 MG/2ML IJ SOLN: 4.0000 mg | Freq: Four times a day (QID) | INTRAMUSCULAR | Status: DC | PRN

## 2022-06-02 MED ORDER — MORPHINE SULFATE (PF) 2 MG/ML IV SOLN: 2.0000 mg | INTRAVENOUS | Status: DC | PRN

## 2022-06-02 MED ORDER — METHYLPREDNISOLONE SODIUM SUCC 40 MG IJ SOLR
40.0000 mg | Freq: Two times a day (BID) | INTRAMUSCULAR | Status: DC
  Administered 2022-06-02: 40 mg via INTRAVENOUS
  Filled 2022-06-02: qty 1

## 2022-06-02 MED ORDER — SODIUM CHLORIDE 0.9 % IV SOLN: INTRAVENOUS | Status: DC

## 2022-06-02 MED ORDER — SODIUM CHLORIDE 0.9 % IV BOLUS
500.0000 mL | Freq: Once | INTRAVENOUS | Status: AC
  Administered 2022-06-02: 500 mL via INTRAVENOUS

## 2022-06-02 MED ORDER — IPRATROPIUM-ALBUTEROL 0.5-2.5 (3) MG/3ML IN SOLN
3.0000 mL | Freq: Four times a day (QID) | RESPIRATORY_TRACT | Status: DC
  Administered 2022-06-02 – 2022-06-03 (×2): 3 mL via RESPIRATORY_TRACT
  Filled 2022-06-02 (×2): qty 3

## 2022-06-02 MED ORDER — GUAIFENESIN ER 600 MG PO TB12
600.0000 mg | ORAL_TABLET | Freq: Two times a day (BID) | ORAL | Status: DC
  Administered 2022-06-02 – 2022-06-04 (×4): 600 mg via ORAL
  Filled 2022-06-02 (×4): qty 1

## 2022-06-02 MED ORDER — ASPIRIN 325 MG PO TBEC
325.0000 mg | DELAYED_RELEASE_TABLET | Freq: Every day | ORAL | Status: DC
  Administered 2022-06-02: 325 mg via ORAL
  Filled 2022-06-02: qty 1

## 2022-06-02 MED ORDER — SODIUM CHLORIDE 0.9 % IV SOLN
1.0000 g | INTRAVENOUS | Status: DC
  Administered 2022-06-02: 1 g via INTRAVENOUS
  Filled 2022-06-02: qty 10

## 2022-06-02 MED ORDER — NITROGLYCERIN 0.4 MG SL SUBL: 0.4000 mg | SUBLINGUAL_TABLET | SUBLINGUAL | Status: DC | PRN

## 2022-06-02 MED ORDER — ACETAMINOPHEN 650 MG RE SUPP: 650.0000 mg | Freq: Four times a day (QID) | RECTAL | Status: DC | PRN

## 2022-06-02 MED ORDER — MAGNESIUM HYDROXIDE 400 MG/5ML PO SUSP: 30.0000 mL | Freq: Every day | ORAL | Status: DC | PRN

## 2022-06-02 MED ORDER — TRAZODONE HCL 50 MG PO TABS
25.0000 mg | ORAL_TABLET | Freq: Every evening | ORAL | Status: DC | PRN
  Administered 2022-06-02 – 2022-06-03 (×2): 25 mg via ORAL
  Filled 2022-06-02 (×2): qty 1

## 2022-06-02 MED ORDER — PREDNISONE 20 MG PO TABS: 40.0000 mg | ORAL_TABLET | Freq: Every day | ORAL | Status: DC

## 2022-06-02 MED ORDER — ENOXAPARIN SODIUM 40 MG/0.4ML IJ SOSY
40.0000 mg | PREFILLED_SYRINGE | INTRAMUSCULAR | Status: DC
  Administered 2022-06-02 – 2022-06-03 (×2): 40 mg via SUBCUTANEOUS
  Filled 2022-06-02 (×2): qty 0.4

## 2022-06-02 MED ORDER — ALBUTEROL SULFATE (2.5 MG/3ML) 0.083% IN NEBU
2.5000 mg | INHALATION_SOLUTION | Freq: Once | RESPIRATORY_TRACT | Status: AC
  Administered 2022-06-02: 2.5 mg via RESPIRATORY_TRACT
  Filled 2022-06-02: qty 3

## 2022-06-02 MED ORDER — ONDANSETRON HCL 4 MG PO TABS: 4.0000 mg | ORAL_TABLET | Freq: Four times a day (QID) | ORAL | Status: DC | PRN

## 2022-06-02 MED ORDER — ACETAMINOPHEN 325 MG PO TABS
650.0000 mg | ORAL_TABLET | Freq: Four times a day (QID) | ORAL | Status: DC | PRN
  Administered 2022-06-03 – 2022-06-04 (×2): 650 mg via ORAL
  Filled 2022-06-02 (×2): qty 2

## 2022-06-02 MED ORDER — HYDROCOD POLI-CHLORPHE POLI ER 10-8 MG/5ML PO SUER: 5.0000 mL | Freq: Two times a day (BID) | ORAL | Status: DC | PRN

## 2022-06-02 NOTE — ED Triage Notes (Signed)
[facility] reports pt coming from home c/o sob. EMS gave Due neb, solumedrol, zofran. Pt had one episode of vomiting.

## 2022-06-02 NOTE — Assessment & Plan Note (Signed)
-   We will continue PPI therapy 

## 2022-06-02 NOTE — Assessment & Plan Note (Signed)
-   She will be admitted to a medical telemetry bed. - We will taper off BiPAP as tolerated.  O2 protocol will be followed. - This is clearly secondary to COPD and asthma acute exacerbation. - Management otherwise as below.

## 2022-06-02 NOTE — Assessment & Plan Note (Addendum)
-   The patient will be admitted to a medical telemetry bed. - We will place the patient IV steroid therapy with IV Solu-Medrol as well as nebulized bronchodilator therapy with duonebs q.i.d. and q.4 hours p.r.n.Marland Kitchen - Mucolytic therapy will be provided with Mucinex and antibiotic therapy with IV Rocephin and p.o. Zithromax. - O2 protocol will be followed. - We will hold Anoro and Trelegy to avoid long acting beta agonist during her exacerbation.

## 2022-06-02 NOTE — Assessment & Plan Note (Addendum)
-   I suspect atypical etiology probably musculoskeletal due to her excessive cough. - We will follow serial troponins and EKGs. - We will place on aspirin as well as as needed sublingual nitroglycerin and IV morphine sulfate for pain especially given her history of chronic tobacco abuse. - We will add D-dimer test especially given her sinus tachycardia. - She had a chest CT lung cancer screening study on 10/11 that showed benign appearance of the lungs however there were nearly mildly enlarged right axillary lymph node that is nonspecific with recommendation for diagnostic mammographic evaluation.

## 2022-06-02 NOTE — Assessment & Plan Note (Addendum)
-   We will continue Wellbutrin XL and Effexor XR. 

## 2022-06-02 NOTE — H&P (Addendum)
[place]   PATIENT NAME: Nicole Mason    MR#:  [id number]  DATE OF BIRTH:  06-Aug-1971  DATE OF ADMISSION:  06/02/2022  PRIMARY CARE PHYSICIAN: System, Provider Not In   Patient is coming from: Home  REQUESTING/REFERRING PHYSICIAN: Ane Payment, MD  CHIEF COMPLAINT:   Chief Complaint  Patient presents with   Shortness of Breath    HISTORY OF PRESENT ILLNESS:  Nicole Mason is a 51 y.o. African-American female with medical history significant for asthma and COPD, depression and GERD, presented to emergency room with acute onset of worsening cough productive of clear sputum as well as dyspnea and wheezing over the last 4 days.  She has been using her bronchodilator inhalers at home without help.  She has a nebulizer machine but without solutions.  She admits to chest pain with cough.  She quit smoking cigarettes on Friday and does not plan to go back.  She has been smoking since she was 51 years old and quit on an intermittent basis.  The last amount she smoked was half a pack of cigarettes per day.  She denies any fever or chills.  No nausea or vomiting or abdominal pain.  No dysuria, oliguria or hematuria or flank pain.   ED Course: When she came to the ER heart rate was 121 with respiratory rate of 24 and pulse oximetry 74% on room air.  On 5 L of O2 by nasal cannula she was 91% and later was placed on BiPAP at 40% FiO2 with improvement of pulse ox amatory 97%.  Labs revealed alk phos of 156 with blood glucose of 184 with otherwise unremarkable CMP.  BN P was 34.4 and high sensitive troponin I 8 and later 9.  Lactic acid 1.9.  CBC showed leukocytosis of 13.7 with neutrophilia.  Respiratory panel is currently pending.  EKG as reviewed by me : EKG showed sinus tachycardia with a rate of 116 with right atrial enlargement.  Imaging: Portable chest x-ray showed no acute cardiopulmonary disease.  The patient was given nebulized albuterol twice and 500 mill IV normal  saline.  She will be admitted to a medical telemetry bed for further evaluation and management. PAST MEDICAL HISTORY:   Past Medical History:  Diagnosis Date   Asthma    COPD (chronic obstructive pulmonary disease) (HCC)   -Depression - GERD  PAST SURGICAL HISTORY:   Past Surgical History:  Procedure Laterality Date   TUBAL LIGATION      SOCIAL HISTORY:   Social History   Tobacco Use   Smoking status: Former    Packs/day: 0.50    Years: 25.00    Total pack years: 12.50    Types: Cigarettes    Quit date: 04/04/2021    Years since quitting: 1.1   Smokeless tobacco: Former  Substance Use Topics   Alcohol use: Yes    Comment: nothing lately    FAMILY HISTORY:   Family History  Problem Relation Age of Onset   Breast cancer Mother 42    DRUG ALLERGIES:   Allergies  Allergen Reactions   Oxycodone-Acetaminophen     Other reaction(s): Other (See Comments)    REVIEW OF SYSTEMS:   ROS As per history of present illness. All pertinent systems were reviewed above. Constitutional, HEENT, cardiovascular, respiratory, GI, GU, musculoskeletal, neuro, psychiatric, endocrine, integumentary and hematologic systems were reviewed and are otherwise negative/unremarkable except for positive findings mentioned above in the HPI.   MEDICATIONS AT HOME:  Prior to Admission medications   Medication Sig Start Date End Date Taking? Authorizing Provider  albuterol (VENTOLIN HFA) 108 (90 Base) MCG/ACT inhaler Inhale 2 puffs into the lungs every 4 (four) hours as needed for shortness of breath or wheezing.    [provider]      VITAL SIGNS:  Blood pressure 118/73, pulse (!) 108, temperature 98.6 F (37 C), temperature source Oral, resp. rate 17, height _0  (1.626 m), weight 73 kg, last menstrual period 07/15/2014, SpO2 97 %.  PHYSICAL EXAMINATION:  Physical Exam  GENERAL: [age] y.o.-year-old patient lying in the bed with moderate respiratory distress with  conversational dyspnea. EYES: Pupils equal, round, reactive to light and accommodation. No scleral icterus. Extraocular muscles intact.  HEENT: Head atraumatic, normocephalic. Oropharynx and nasopharynx clear.  NECK:  Supple, no jugular venous distention. No thyroid enlargement, no tenderness.  LUNGS: Diffuse expiratory wheezes with tight expiratory airflow and harsh vesicular breathing.  No use of accessory muscles of respiration.  CARDIOVASCULAR: Regular rate and rhythm, S1, S2 normal. No murmurs, rubs, or gallops.  ABDOMEN: Soft, nondistended, nontender. Bowel sounds present. No organomegaly or mass.  EXTREMITIES: No pedal edema, cyanosis, or clubbing.  NEUROLOGIC: Cranial nerves II through XII are intact. Muscle strength 5/5 in all extremities. Sensation intact. Gait not checked.  PSYCHIATRIC: The patient is alert and oriented x 3.  Normal affect and good eye contact. SKIN: No obvious rash, lesion, or ulcer.   LABORATORY PANEL:   CBC Recent Labs  Lab 06/02/22 1848  WBC 13.7*  HGB 14.6  HCT 45.8  PLT 309   ------------------------------------------------------------------------------------------------------------------  Chemistries  Recent Labs  Lab 06/02/22 1848  NA 137  K 3.9  CL 104  CO2 26  GLUCOSE 184*  BUN 10  CREATININE 0.75  CALCIUM 9.5  AST 17  ALT 13  ALKPHOS 156*  BILITOT 0.8   ------------------------------------------------------------------------------------------------------------------  Cardiac Enzymes No results for input(s): "TROPONINI" in the last 168 hours. ------------------------------------------------------------------------------------------------------------------  RADIOLOGY:  DG Chest Port 1 View  Result Date: 06/02/2022 CLINICAL DATA:  Shortness of breath. EXAM: PORTABLE CHEST 1 VIEW COMPARISON:  Chest radiograph dated 04/06/2021. FINDINGS: No focal consolidation, pleural effusion or pneumothorax. The cardiac silhouette is within  normal limits. No acute osseous pathology. IMPRESSION: No active disease. Electronically Signed   By: Anner Crete M.D.   On: 06/02/2022 19:22      IMPRESSION AND PLAN:  Assessment and Plan: * Acute respiratory failure with hypoxia ([place]) - She will be admitted to a medical telemetry bed. - We will taper off BiPAP as tolerated.  O2 protocol will be followed. - This is clearly secondary to COPD and asthma acute exacerbation. - Management otherwise as below.  Acute exacerbation of COPD with asthma ([place]) - The patient will be admitted to a medical telemetry bed. - We will place the patient IV steroid therapy with IV Solu-Medrol as well as nebulized bronchodilator therapy with duonebs q.i.d. and q.4 hours p.r.n.Marland Kitchen - Mucolytic therapy will be provided with Mucinex and antibiotic therapy with IV Rocephin and p.o. Zithromax. - O2 protocol will be followed. - We will hold Anoro and Trelegy to avoid long acting beta agonist during her exacerbation.   Chest pain - I suspect atypical etiology probably musculoskeletal due to her excessive cough. - We will follow serial troponins and EKGs. - We will place on aspirin as well as as needed sublingual nitroglycerin and IV morphine sulfate for pain especially given her history of chronic tobacco abuse. - We  will add D-dimer test especially given her sinus tachycardia. - She had a chest CT lung cancer screening study on 10/11 that showed benign appearance of the lungs however there were nearly mildly enlarged right axillary lymph node that is nonspecific with recommendation for diagnostic mammographic evaluation.    GERD without esophagitis - We will continue PPI therapy  Depression - We will continue Wellbutrin XL and Effexor XR.    DVT prophylaxis: Lovenox.  Advanced Care Planning:  Code Status: full code.  Family Communication:  The plan of care was discussed in details with the patient (and family). I answered all questions. The patient  agreed to proceed with the above mentioned plan. Further management will depend upon hospital course. Disposition Plan: Back to previous home environment Consults called: none.  All the records are reviewed and case discussed with ED provider.  Status is: Inpatient   At the time of the admission, it appears that the appropriate admission status for this patient is inpatient.  This is judged to be reasonable and necessary in order to provide the required intensity of service to ensure the patient's safety given the presenting symptoms, physical exam findings and initial radiographic and laboratory data in the context of comorbid conditions.  The patient requires inpatient status due to high intensity of service, high risk of further deterioration and high frequency of surveillance required.  I certify that at the time of admission, it is my clinical judgment that the patient will require inpatient hospital care extending more than 2 midnights.                            Dispo: The patient is from: Home              Anticipated d/c is to: Home              Patient currently is not medically stable to d/c.              Difficult to place patient: No Authorized and performed by: Eugenie Norrie, MD Total critical care time: Approximately   45    minutes. Due to a high probability of clinically significant, life-threatening deterioration, the patient required my highest level of preparedness to intervene emergently and I personally spent this critical care time directly and personally managing the patient.  This critical care time included obtaining a history, examining the patient, pulse oximetry, ordering and review of studies, arranging urgent treatment with development of management plan, evaluation of patient's response to treatment, frequent reassessment, and discussions with other providers. This critical care time was performed to assess and manage the high probability of imminent, life-threatening  deterioration that could result in multiorgan failure.  It was exclusive of separately billable procedures and treating other patients and teaching time.   Christel Mormon M.D on 06/02/2022 at 9:58 PM  [facility]   From 7 PM-7 AM, contact night-coverage [email or web address]  CC: Primary care physician; System, Provider Not In

## 2022-06-02 NOTE — ED Provider Notes (Signed)
[facility] Provider Note    Event Date/Time   First MD Initiated Contact with Patient 06/02/22 1851     (approximate)   History   Shortness of Breath   HPI  Nicole Mason is a 51 y.o. female with history of asthma and COPD who presents with increased shortness of breath over the last 2 days associated with nonproductive cough and wheezing.  She denies any fever or chills.  She has had some chest tightness but no pain.  She has had no vomiting or diarrhea.  It has not been relieved by albuterol at home.  I reviewed the past medical records.  Per the hospitalist discharge summary from 04/09/2021 she was admitted at that time with shortness of breath and acute hypoxia.  She was diagnosed with pneumonia as well as hypokalemia.   Physical Exam   Triage Vital Signs: ED Triage Vitals  Enc Vitals Group     BP 06/02/22 1840 132/82     Pulse Rate 06/02/22 1840 (!) 121     Resp 06/02/22 1840 (!) 24     Temp 06/02/22 1840 98.6 F (37 C)     Temp Source 06/02/22 1840 Oral     SpO2 06/02/22 1840 (!) 74 %     Weight 06/02/22 1841 161 lb (73 kg)     Height 06/02/22 1841 5\' 4"  (1.626 m)     Head Circumference --      Peak Flow --      Pain Score 06/02/22 1839 0     Pain Loc --      Pain Edu? --      Excl. in [place]? --     Most recent vital signs: Vitals:   06/02/22 2000 06/02/22 2030  BP: 107/78 118/73  Pulse: (!) 114 (!) 108  Resp: 19 17  Temp:    SpO2: 97% 97%     General: Alert and oriented. CV:  Good peripheral perfusion.  Normal heart sounds. Resp:  Increased respiratory effort.  Diffuse wheezing bilaterally. Abd:  No distention.  Other:  No peripheral edema.   ED Results / Procedures / Treatments   Labs (all labs ordered are listed, but only abnormal results are displayed) Labs Reviewed  COMPREHENSIVE METABOLIC PANEL - Abnormal; Notable for the following components:      Result Value   Glucose, Bld 184 (*)    Total Protein 8.5 (*)     Alkaline Phosphatase 156 (*)    All other components within normal limits  CBC WITH DIFFERENTIAL/PLATELET - Abnormal; Notable for the following components:   WBC 13.7 (*)    Neutro Abs 9.3 (*)    All other components within normal limits  RESP PANEL BY RT-PCR (FLU A&B, COVID) ARPGX2  LACTIC ACID, PLASMA  BRAIN NATRIURETIC PEPTIDE  LACTIC ACID, PLASMA  URINALYSIS, ROUTINE W REFLEX MICROSCOPIC  HIV ANTIBODY (ROUTINE TESTING W REFLEX)  BASIC METABOLIC PANEL  CBC  POC URINE PREG, ED  TROPONIN I (HIGH SENSITIVITY)  TROPONIN I (HIGH SENSITIVITY)     EKG  ED ECG REPORT I, Arta Silence, the attending physician, personally viewed and interpreted this ECG.  Date: 06/02/2022 EKG Time: 1920 Rate: 116 Rhythm: Sinus tachycardia QRS Axis: normal Intervals: normal ST/T Wave abnormalities: normal Narrative Interpretation: no evidence of acute ischemia    RADIOLOGY  Chest x-ray: I independently viewed and interpreted the images; there is no focal consolidation or edema  PROCEDURES:  Critical Care performed: Yes, see critical care procedure  note(s)  .Critical Care  Performed by: Dionne Bucy, MD Authorized by: Dionne Bucy, MD   Critical care provider statement:    Critical care time (minutes):  30   Critical care was necessary to treat or prevent imminent or life-threatening deterioration of the following conditions:  Respiratory failure   Critical care was time spent personally by me on the following activities:  Development of treatment plan with patient or surrogate, discussions with consultants, evaluation of patient's response to treatment, examination of patient, ordering and review of laboratory studies, ordering and review of radiographic studies, ordering and performing treatments and interventions, pulse oximetry, re-evaluation of patient's condition, review of old charts and obtaining history from patient or surrogate   Care discussed with: admitting  provider      MEDICATIONS ORDERED IN ED: Medications  cefTRIAXone (ROCEPHIN) 1 g in sodium chloride 0.9 % 100 mL IVPB (has no administration in time range)  methylPREDNISolone sodium succinate (SOLU-MEDROL) 40 mg/mL injection 40 mg (has no administration in time range)    Followed by  predniSONE (DELTASONE) tablet 40 mg (has no administration in time range)  enoxaparin (LOVENOX) injection 40 mg (has no administration in time range)  0.9 %  sodium chloride infusion (has no administration in time range)  acetaminophen (TYLENOL) tablet 650 mg (has no administration in time range)    Or  acetaminophen (TYLENOL) suppository 650 mg (has no administration in time range)  traZODone (DESYREL) tablet 25 mg (has no administration in time range)  magnesium hydroxide (MILK OF MAGNESIA) suspension 30 mL (has no administration in time range)  ondansetron (ZOFRAN) tablet 4 mg (has no administration in time range)    Or  ondansetron (ZOFRAN) injection 4 mg (has no administration in time range)  ipratropium-albuterol (DUONEB) 0.5-2.5 (3) MG/3ML nebulizer solution 3 mL (has no administration in time range)  guaiFENesin (MUCINEX) 12 hr tablet 600 mg (has no administration in time range)  chlorpheniramine-HYDROcodone (TUSSIONEX) 10-8 MG/5ML suspension 5 mL (has no administration in time range)  sodium chloride 0.9 % bolus 500 mL (0 mLs Intravenous Stopped 06/02/22 2040)  albuterol (PROVENTIL) (2.5 MG/3ML) 0.083% nebulizer solution 2.5 mg (2.5 mg Nebulization Given 06/02/22 1927)  albuterol (PROVENTIL) (2.5 MG/3ML) 0.083% nebulizer solution 2.5 mg (2.5 mg Nebulization Given 06/02/22 1927)     IMPRESSION / MDM / ASSESSMENT AND PLAN / ED COURSE  I reviewed the triage vital signs and the nursing notes.  51 year old female with a self-reported history of asthma and COPD presents with increased shortness of breath over the last 2 days.  EMS found her to be hypoxic and she was given Solu-Medrol and  bronchodilators in the field.  On ED arrival, O2 saturation was still in the mid to high 80s on room air and in the low 90s on 5 L by nasal cannula.  She demonstrates increased work of breathing but no acute respiratory distress.  Lung exam reveals diffuse wheezing bilaterally.  Differential diagnosis includes, but is not limited to, asthma/COPD exacerbation, acute bronchitis, pneumonia.  Given the significantly abnormal breath sounds and lack of chest pain or DVT symptoms I do not suspect PE.  Given the patient's work of breathing and significant oxygen requirement I have ordered BiPAP.  We will obtain chest x-ray, lab work-up, give additional bronchodilators, and reassess.  Patient's presentation is most consistent with acute presentation with potential threat to life or bodily function.  The patient is on the cardiac monitor to evaluate for evidence of arrhythmia and/or significant heart rate changes.  -----------------------------------------  9:16 PM on 06/02/2022 -----------------------------------------  Chest x-ray shows no acute findings.  Lab work-up reveals mild leukocytosis and normal electrolytes.  Troponin and BNP are negative.  Lactate is normal.  Respiratory panel is pending.  The patient's respiratory status has significantly improved.  I was able to take her off the BiPAP and she is on 4 L O2 by nasal cannula currently.  She will still need admission for further treatment.  I consulted Dr. Arville Care from the hospitalist service; based on our discussion he agrees to admit the patient.   FINAL CLINICAL IMPRESSION(S) / ED DIAGNOSES   Final diagnoses:  Acute respiratory failure with hypoxia (HCC)     Rx / DC Orders   ED Discharge Orders     None        Note:  This document was prepared using Dragon voice recognition software and may include unintentional dictation errors.    Dionne Bucy, MD 06/02/22 2120

## 2022-06-03 DIAGNOSIS — J9601 Acute respiratory failure with hypoxia: Secondary | ICD-10-CM | POA: Diagnosis not present

## 2022-06-03 LAB — BASIC METABOLIC PANEL
Anion gap: 5 (ref 5–15)
BUN: 16 mg/dL (ref 6–20)
CO2: 26 mmol/L (ref 22–32)
Calcium: 8.6 mg/dL — ABNORMAL LOW (ref 8.9–10.3)
Chloride: 108 mmol/L (ref 98–111)
Creatinine, Ser: 0.92 mg/dL (ref 0.44–1.00)
GFR, Estimated: >60 mL/min (ref >60)
Glucose, Bld: 154 mg/dL — ABNORMAL HIGH (ref 70–99)
Potassium: 4.1 mmol/L (ref 3.5–5.1)
Sodium: 139 mmol/L (ref 135–145)

## 2022-06-03 LAB — CBC
HCT: 38.4 % (ref 36.0–46.0)
Hemoglobin: 12.4 g/dL (ref 12.0–15.0)
MCH: 29.8 pg (ref 26.0–34.0)
MCHC: 32.3 g/dL (ref 30.0–36.0)
MCV: 92.3 fL (ref 80.0–100.0)
Platelets: 269 10*3/uL (ref 150–400)
RBC: 4.16 MIL/uL (ref 3.87–5.11)
RDW: 13.6 % (ref 11.5–15.5)
WBC: 12.2 10*3/uL — ABNORMAL HIGH (ref 4.0–10.5)
nRBC: 0 % (ref 0.0–0.2)

## 2022-06-03 LAB — HIV ANTIBODY (ROUTINE TESTING W REFLEX): HIV Screen 4th Generation wRfx: NONREACTIVE

## 2022-06-03 LAB — PROCALCITONIN: Procalcitonin: 0.42 ng/mL

## 2022-06-03 MED ORDER — IPRATROPIUM-ALBUTEROL 0.5-2.5 (3) MG/3ML IN SOLN
3.0000 mL | RESPIRATORY_TRACT | Status: DC
  Administered 2022-06-03 (×3): 3 mL via RESPIRATORY_TRACT
  Filled 2022-06-03 (×3): qty 3

## 2022-06-03 MED ORDER — ASPIRIN 81 MG PO TBEC
81.0000 mg | DELAYED_RELEASE_TABLET | Freq: Every day | ORAL | Status: DC
  Administered 2022-06-03 – 2022-06-04 (×2): 81 mg via ORAL
  Filled 2022-06-03 (×2): qty 1

## 2022-06-03 MED ORDER — METHYLPREDNISOLONE SODIUM SUCC 125 MG IJ SOLR
60.0000 mg | Freq: Every day | INTRAMUSCULAR | Status: AC
  Administered 2022-06-03 – 2022-06-04 (×2): 60 mg via INTRAVENOUS
  Filled 2022-06-03 (×2): qty 2

## 2022-06-03 MED ORDER — ARFORMOTEROL TARTRATE 15 MCG/2ML IN NEBU
15.0000 ug | INHALATION_SOLUTION | Freq: Two times a day (BID) | RESPIRATORY_TRACT | Status: DC
  Administered 2022-06-03 – 2022-06-04 (×2): 15 ug via RESPIRATORY_TRACT
  Filled 2022-06-03 (×4): qty 2

## 2022-06-03 MED ORDER — IPRATROPIUM-ALBUTEROL 0.5-2.5 (3) MG/3ML IN SOLN
3.0000 mL | Freq: Four times a day (QID) | RESPIRATORY_TRACT | Status: DC
  Administered 2022-06-04 (×2): 3 mL via RESPIRATORY_TRACT
  Filled 2022-06-03 (×2): qty 3

## 2022-06-03 MED ORDER — VENLAFAXINE HCL ER 75 MG PO CP24
75.0000 mg | ORAL_CAPSULE | Freq: Every day | ORAL | Status: DC
  Administered 2022-06-03 – 2022-06-04 (×2): 75 mg via ORAL
  Filled 2022-06-03 (×2): qty 1

## 2022-06-03 MED ORDER — BUPROPION HCL ER (XL) 150 MG PO TB24
150.0000 mg | ORAL_TABLET | Freq: Every day | ORAL | Status: DC
  Administered 2022-06-03 – 2022-06-04 (×2): 150 mg via ORAL
  Filled 2022-06-03 (×2): qty 1

## 2022-06-03 MED ORDER — SODIUM CHLORIDE 0.9 % IV SOLN
500.0000 mg | Freq: Every day | INTRAVENOUS | Status: DC
  Administered 2022-06-03 – 2022-06-04 (×2): 500 mg via INTRAVENOUS
  Filled 2022-06-03: qty 5
  Filled 2022-06-03: qty 500

## 2022-06-03 MED ORDER — BUDESONIDE 0.25 MG/2ML IN SUSP
0.2500 mg | Freq: Two times a day (BID) | RESPIRATORY_TRACT | Status: DC
  Administered 2022-06-03 – 2022-06-04 (×3): 0.25 mg via RESPIRATORY_TRACT
  Filled 2022-06-03 (×3): qty 2

## 2022-06-03 NOTE — Progress Notes (Addendum)
PROGRESS NOTE    Nicole Mason  QIO:[id number] DOB: 10-Dec-1970 DOA: 06/02/2022 PCP: System, Provider Not In    Brief Narrative:  51 y.o. African-American female with medical history significant for asthma and COPD, depression and GERD, presented to emergency room with acute onset of worsening cough productive of clear sputum as well as dyspnea and wheezing over the last 4 days.  She has been using her bronchodilator inhalers at home without help.  She has a nebulizer machine but without solutions.  She admits to chest pain with cough.  She quit smoking cigarettes on Friday and does not plan to go back.  She has been smoking since she was 51 years old and quit on an intermittent basis.  The last amount she smoked was half a pack of cigarettes per day.  She denies any fever or chills.  No nausea or vomiting or abdominal pain.  No dysuria, oliguria or hematuria or flank pain.  Required BiPAP initially.  Now weaned off.  At time my evaluation on 10/23 patient is stable.  She is on 3 L.   Assessment & Plan:   Principal Problem:   Acute respiratory failure with hypoxia (HCC) Active Problems:   Acute exacerbation of COPD with asthma (HCC)   Chest pain   Depression   GERD without esophagitis  Acute exacerbation of COPD Acute respiratory failure with hypoxia, secondary to above Atypical chest pain Patient initially required BiPAP on presentation.  Bronchodilator at home has not been helping.  Patient has a recent history of smoking, states she quit a few days prior to presentation and has no intention of going back on. Plan: IV steroids Solu-Medrol 60 mg daily DuoNebs every 4 hours Twice daily Brovana Twice daily Pulmicort Wean oxygen as tolerated No evidence of pneumonia on imaging DC Rocephin Start azithromycin for anti-inflammatory effect Possible discharge 10/24 if able to wean from supplemental oxygen As needed pain management for chest pain  GERD PPI  Depression PTA  Wellbutrin PTA Effexor   DVT prophylaxis: SQ Lovenox Code Status: Full Family Communication: Husband Ethelene Browns [phone] on 10/23  Disposition Plan: Status is: Inpatient Remains inpatient appropriate because: Acute COPD exacerbation.  Acute hypoxic respiratory failure   Level of care: Telemetry Medical  Consultants:  None  Procedures:  None  Antimicrobials: Azithromycin   Subjective: Seen and examined.  Resting comfortably in bed.  No visible distress.  No complaints of pain.  Objective: Vitals:   06/03/22 1000 06/03/22 1030 06/03/22 1100 06/03/22 1234  BP: 113/64 113/71 123/75 116/61  Pulse: (!) 102 95 100 96  Resp: 16 16 11 20   Temp:   98 F (36.7 C) 98.3 F (36.8 C)  TempSrc:   Oral Oral  SpO2: 99% 95% 97% 96%  Weight:      Height:       No intake or output data in the 24 hours ending 06/03/22 1316 Filed Weights   06/02/22 1841  Weight: 73 kg    Examination:  General exam: NAD Respiratory system: Diffuse wheeze.  Bibasilar crackles.  Normal work of breathing.  2 L Cardiovascular system: S1-S2, RRR, no murmurs, no pedal edema Gastrointestinal system: Abdomen is nondistended, soft and nontender. No organomegaly or masses felt. Normal bowel sounds heard. Central nervous system: Alert and oriented. No focal neurological deficits. Extremities: Symmetric 5 x 5 power. Skin: No rashes, lesions or ulcers Psychiatry: Judgement and insight appear normal. Mood & affect appropriate.     Data Reviewed: I have personally reviewed following labs  and imaging studies  CBC: Recent Labs  Lab 06/02/22 1848 06/03/22 0403  WBC 13.7* 12.2*  NEUTROABS 9.3*  --   HGB 14.6 12.4  HCT 45.8 38.4  MCV 92.3 92.3  PLT 309 144   Basic Metabolic Panel: Recent Labs  Lab 06/02/22 1848 06/03/22 0403  NA 137 139  K 3.9 4.1  CL 104 108  CO2 26 26  GLUCOSE 184* 154*  BUN 10 16  CREATININE 0.75 0.92  CALCIUM 9.5 8.6*   GFR: Estimated Creatinine Clearance: 70.8 mL/min  (by C-G formula based on SCr of 0.92 mg/dL). Liver Function Tests: Recent Labs  Lab 06/02/22 1848  AST 17  ALT 13  ALKPHOS 156*  BILITOT 0.8  PROT 8.5*  ALBUMIN 4.2   No results for input(s): "LIPASE", "AMYLASE" in the last 168 hours. No results for input(s): "AMMONIA" in the last 168 hours. Coagulation Profile: No results for input(s): "INR", "PROTIME" in the last 168 hours. Cardiac Enzymes: No results for input(s): "CKTOTAL", "CKMB", "CKMBINDEX", "TROPONINI" in the last 168 hours. BNP (last 3 results) No results for input(s): "PROBNP" in the last 8760 hours. HbA1C: No results for input(s): "HGBA1C" in the last 72 hours. CBG: No results for input(s): "GLUCAP" in the last 168 hours. Lipid Profile: No results for input(s): "CHOL", "HDL", "LDLCALC", "TRIG", "CHOLHDL", "LDLDIRECT" in the last 72 hours. Thyroid Function Tests: No results for input(s): "TSH", "T4TOTAL", "FREET4", "T3FREE", "THYROIDAB" in the last 72 hours. Anemia Panel: No results for input(s): "VITAMINB12", "FOLATE", "FERRITIN", "TIBC", "IRON", "RETICCTPCT" in the last 72 hours. Sepsis Labs: Recent Labs  Lab 06/02/22 1848 06/02/22 2031 06/03/22 0403  PROCALCITON  --   --  0.42  LATICACIDVEN 1.9 1.3  --     Recent Results (from the past 240 hour(s))  Resp Panel by RT-PCR (Flu A&B, Covid) Anterior Nasal Swab     Status: None   Collection Time: 06/02/22  8:31 PM   Specimen: Anterior Nasal Swab  Result Value Ref Range Status   SARS Coronavirus 2 by RT PCR NEGATIVE NEGATIVE Final    Comment: (NOTE) SARS-CoV-2 target nucleic acids are NOT DETECTED.  The SARS-CoV-2 RNA is generally detectable in upper respiratory specimens during the acute phase of infection. The lowest concentration of SARS-CoV-2 viral copies this assay can detect is 138 copies/mL. A negative result does not preclude SARS-Cov-2 infection and should not be used as the sole basis for treatment or other patient management decisions. A  negative result may occur with  improper specimen collection/handling, submission of specimen other than nasopharyngeal swab, presence of viral mutation(s) within the areas targeted by this assay, and inadequate number of viral copies(<138 copies/mL). A negative result must be combined with clinical observations, patient history, and epidemiological information. The expected result is Negative.  Fact Sheet for Patients:  EntrepreneurPulse.com.au  Fact Sheet for Healthcare Providers:  IncredibleEmployment.be  This test is no t yet approved or cleared by the [place] FDA and  has been authorized for detection and/or diagnosis of SARS-CoV-2 by FDA under an Emergency Use Authorization (EUA). This EUA will remain  in effect (meaning this test can be used) for the duration of the COVID-19 declaration under Section 564(b)(1) of the Act, 21 U.S.C.section 360bbb-3(b)(1), unless the authorization is terminated  or revoked sooner.       Influenza A by PCR NEGATIVE NEGATIVE Final   Influenza B by PCR NEGATIVE NEGATIVE Final    Comment: (NOTE) The Xpert Xpress SARS-CoV-2/FLU/RSV plus assay is intended as an aid  in the diagnosis of influenza from Nasopharyngeal swab specimens and should not be used as a sole basis for treatment. Nasal washings and aspirates are unacceptable for Xpert Xpress SARS-CoV-2/FLU/RSV testing.  Fact Sheet for Patients: BloggerCourse.com  Fact Sheet for Healthcare Providers: SeriousBroker.it  This test is not yet approved or cleared by the [place] FDA and has been authorized for detection and/or diagnosis of SARS-CoV-2 by FDA under an Emergency Use Authorization (EUA). This EUA will remain in effect (meaning this test can be used) for the duration of the COVID-19 declaration under Section 564(b)(1) of the Act, 21 U.S.C. section 360bbb-3(b)(1), unless the authorization  is terminated or revoked.  Performed at [facility], [address], [place], [place] [address]          Radiology Studies: [facility] Chest [place] 1 View  Result Date: 06/02/2022 CLINICAL DATA:  Shortness of breath. EXAM: PORTABLE CHEST 1 VIEW COMPARISON:  Chest radiograph dated 04/06/2021. FINDINGS: No focal consolidation, pleural effusion or pneumothorax. The cardiac silhouette is within normal limits. No acute osseous pathology. IMPRESSION: No active disease. Electronically Signed   By: Elgie Collard M.D.   On: 06/02/2022 19:22        Scheduled Meds:  arformoterol  15 mcg Nebulization BID   aspirin EC  81 mg Oral Daily   budesonide (PULMICORT) nebulizer solution  0.25 mg Nebulization BID   buPROPion  150 mg Oral Daily   enoxaparin (LOVENOX) injection  40 mg Subcutaneous Q24H   guaiFENesin  600 mg Oral BID   ipratropium-albuterol  3 mL Nebulization Q4H   methylPREDNISolone (SOLU-MEDROL) injection  60 mg Intravenous Daily   venlafaxine XR  75 mg Oral Daily   Continuous Infusions:  azithromycin Stopped (06/03/22 1108)     LOS: 1 day     Tresa Moore, MD [facility]   If 7PM-7AM, please contact night-coverage  06/03/2022, 1:16 PM

## 2022-06-03 NOTE — ED Notes (Signed)
Accidentally administered 125 mg Solu-Medrol instead of the 60 mg that was ordered. Nicole Mann, MD, preceptor and charge nurse made aware.

## 2022-06-03 NOTE — Progress Notes (Signed)
Received report from ER for patient coming to room 203.  Room is still dirty from previous patient.  Will notify sending RN when room is clean.

## 2022-06-04 DIAGNOSIS — J9601 Acute respiratory failure with hypoxia: Secondary | ICD-10-CM | POA: Diagnosis not present

## 2022-06-04 LAB — CBC WITH DIFFERENTIAL/PLATELET
Abs Immature Granulocytes: 0.09 10*3/uL — ABNORMAL HIGH (ref 0.00–0.07)
Basophils Absolute: 0 10*3/uL (ref 0.0–0.1)
Basophils Relative: 0 %
Eosinophils Absolute: 0 10*3/uL (ref 0.0–0.5)
Eosinophils Relative: 0 %
HCT: 37.8 % (ref 36.0–46.0)
Hemoglobin: 12.3 g/dL (ref 12.0–15.0)
Immature Granulocytes: 1 %
Lymphocytes Relative: 18 %
Lymphs Abs: 2.7 10*3/uL (ref 0.7–4.0)
MCH: 29.6 pg (ref 26.0–34.0)
MCHC: 32.5 g/dL (ref 30.0–36.0)
MCV: 91.1 fL (ref 80.0–100.0)
Monocytes Absolute: 0.8 10*3/uL (ref 0.1–1.0)
Monocytes Relative: 5 %
Neutro Abs: 11.8 10*3/uL — ABNORMAL HIGH (ref 1.7–7.7)
Neutrophils Relative %: 76 %
Platelets: 290 10*3/uL (ref 150–400)
RBC: 4.15 MIL/uL (ref 3.87–5.11)
RDW: 14.1 % (ref 11.5–15.5)
WBC: 15.4 10*3/uL — ABNORMAL HIGH (ref 4.0–10.5)
nRBC: 0 % (ref 0.0–0.2)

## 2022-06-04 LAB — BASIC METABOLIC PANEL
Anion gap: 7 (ref 5–15)
BUN: 20 mg/dL (ref 6–20)
CO2: 25 mmol/L (ref 22–32)
Calcium: 9.3 mg/dL (ref 8.9–10.3)
Chloride: 108 mmol/L (ref 98–111)
Creatinine, Ser: 0.84 mg/dL (ref 0.44–1.00)
GFR, Estimated: >60 mL/min (ref >60)
Glucose, Bld: 107 mg/dL — ABNORMAL HIGH (ref 70–99)
Potassium: 3.7 mmol/L (ref 3.5–5.1)
Sodium: 140 mmol/L (ref 135–145)

## 2022-06-04 MED ORDER — PREDNISONE 20 MG PO TABS: 40.0000 mg | ORAL_TABLET | Freq: Every day | ORAL | Status: DC

## 2022-06-04 MED ORDER — PREDNISONE 20 MG PO TABS: 40.0000 mg | ORAL_TABLET | Freq: Every day | ORAL | 0 refills | Status: AC

## 2022-06-04 MED ORDER — GUAIFENESIN ER 600 MG PO TB12: 600.0000 mg | ORAL_TABLET | Freq: Two times a day (BID) | ORAL | 0 refills | Status: AC

## 2022-06-04 NOTE — Progress Notes (Signed)
Patient ready for discharge. PIV removed. Family at bedside.

## 2022-06-04 NOTE — TOC Initial Note (Signed)
Transition of Care [facility]) - Initial/Assessment Note    Patient Details  Name: Nicole Mason MRN: [id number] Date of Birth: 01/01/1971  [facility]) CM/SW Contact:    Beverly Sessions, RN Phone Number: 06/04/2022, 11:24 AM  Clinical Narrative:                      Admitted for: respiratory failure Admitted from: home with husband [id number]   Husband at bedside and states he will transport at discharge [facility] consult for medication assistance.  Patient states that she has difficulty affording her copays for some of her inhalers.  I notified patient that due to her having rx coverage there is not any assistance that I can provide for copays.  I did recommend that she follow up with her pulmonologist to determine if any changes needed to be made to her regimen.  I also recommended that she check apps like good rx to determine if it would be any cheaper then her current copay. Patient in agreement.  MD and bedside RN updated      Patient Goals and CMS Choice        Expected Discharge Plan and Services                                                Prior Living Arrangements/Services                       Activities of Daily Living Home Assistive Devices/Equipment: None ADL Screening (condition at time of admission) Patient's cognitive ability adequate to safely complete daily activities?: Yes Is the patient deaf or have difficulty hearing?: No Does the patient have difficulty seeing, even when wearing glasses/contacts?: No Does the patient have difficulty concentrating, remembering, or making decisions?: No Patient able to express need for assistance with ADLs?: Yes Does the patient have difficulty dressing or bathing?: No Independently performs ADLs?: Yes (appropriate for developmental age) Does the patient have difficulty walking or climbing stairs?: No Weakness of Legs: None Weakness of Arms/Hands: None  Permission Sought/Granted                   Emotional Assessment              Admission diagnosis:  Acute respiratory failure with hypoxia ([place]) [J96.01] Acute exacerbation of COPD with asthma ([place]) [J44.1, J45.901] Patient Active Problem List   Diagnosis Date Noted   Acute exacerbation of COPD with asthma ([place]) 06/02/2022   Depression 06/02/2022   GERD without esophagitis 06/02/2022   Acute respiratory failure with hypoxia ([place]) 06/02/2022   Chest pain 06/02/2022   Sepsis due to pneumonia [facility]) 04/07/2021   Shortness of breath 04/06/2021   PCP:  System, Provider Not In Pharmacy:   [place] [place] [address] [place] [address] Phone: [phone] Fax: [phone]     Social Determinants of Health (SDOH) Interventions    Readmission Risk Interventions     No data to display

## 2022-06-04 NOTE — Discharge Summary (Signed)
Physician Discharge Summary  Nicole Mason ZHG:[id number] DOB: 1971/05/11 DOA: 06/02/2022  PCP: System, Provider Not In  Admit date: 06/02/2022 Discharge date: 06/04/2022  Admitted From: Home Disposition:  Home  Recommendations for Outpatient Follow-up:  Follow up with PCP in 1-2 weeks Follow-up with pulmonology Dr. Meredeth Ide  Home Health: No Equipment/Devices: None  Discharge Condition: Stable CODE STATUS: Full Diet recommendation: Regular  Brief/Interim Summary: 51 y.o. African-American female with medical history significant for asthma and COPD, depression and GERD, presented to emergency room with acute onset of worsening cough productive of clear sputum as well as dyspnea and wheezing over the last 4 days.  She has been using her bronchodilator inhalers at home without help.  She has a nebulizer machine but without solutions.  She admits to chest pain with cough.  She quit smoking cigarettes on Friday and does not plan to go back.  She has been smoking since she was 51 years old and quit on an intermittent basis.  The last amount she smoked was half a pack of cigarettes per day.  She denies any fever or chills.  No nausea or vomiting or abdominal pain.  No dysuria, oliguria or hematuria or flank pain.   Required BiPAP initially.  Now weaned off.  At time my evaluation on 10/23 patient is stable.  She is on 3 L.  Patient weaned off to 3 L quickly.  On day of discharge patient is stable on room air.  Shortness of breath has improved.  Wheezing and adventitious breath sounds have resolved.  Patient moving good air.  Stable for DC at this time.  No changes made in home bronchodilator regimen.  Will recommend prednisone 40 mg a day x5 days following DC.  Follow-up outpatient PCP and pulmonology.    Discharge Diagnoses:  Principal Problem:   Acute respiratory failure with hypoxia (HCC) Active Problems:   Acute exacerbation of COPD with asthma (HCC)   Chest pain   Depression   GERD  without esophagitis  Acute exacerbation of COPD Acute respiratory failure with hypoxia, secondary to above Atypical chest pain Patient initially required BiPAP on presentation.  Bronchodilator at home has not been helping.  Patient has a recent history of smoking, states she quit a few days prior to presentation and has no intention of going back on. Plan: IV steroids and aggressive nebulizer regimen have been discontinued.  Patient can resume her home bronchodilator regimen.  Prednisone 40 mg a day x5 days prescribed on discharge.  Follow-up outpatient PCP and pulmonology.  Discharge Instructions  Discharge Instructions     Diet - low sodium heart healthy   Complete by: As directed    Increase activity slowly   Complete by: As directed       Allergies as of 06/04/2022       Reactions   Oxycodone-acetaminophen    Other reaction(s): Other (See Comments)        Medication List     TAKE these medications    acetaminophen 650 MG CR tablet Commonly known as: TYLENOL Take 650 mg by mouth every 8 (eight) hours as needed.   albuterol 108 (90 Base) MCG/ACT inhaler Commonly known as: VENTOLIN HFA Inhale 2 puffs into the lungs every 4 (four) hours as needed for shortness of breath or wheezing.   Anoro Ellipta 62.5-25 MCG/ACT Aepb Generic drug: umeclidinium-vilanterol Inhale 1 puff into the lungs daily.   aspirin EC 81 MG tablet Take 81 mg by mouth daily.   buPROPion 150 MG  24 hr tablet Commonly known as: WELLBUTRIN XL Take 150 mg by mouth daily.   guaiFENesin 600 MG 12 hr tablet Commonly known as: MUCINEX Take 1 tablet (600 mg total) by mouth 2 (two) times daily for 7 days.   predniSONE 20 MG tablet Commonly known as: DELTASONE Take 2 tablets (40 mg total) by mouth daily with breakfast for 5 days. Start taking on: June 05, 2022   venlafaxine XR 75 MG 24 hr capsule Commonly known as: EFFEXOR-XR Take 75 mg by mouth daily.        Follow-up Information      Mertie Moores, MD. Schedule an appointment as soon as possible for a visit in 1 week(s).   Specialty: Specialist Contact information: [address] [place] [address] [phone]                Allergies  Allergen Reactions   Oxycodone-Acetaminophen     Other reaction(s): Other (See Comments)    Consultations: None   Procedures/Studies: DG Chest Port 1 View  Result Date: 06/02/2022 CLINICAL DATA:  Shortness of breath. EXAM: PORTABLE CHEST 1 VIEW COMPARISON:  Chest radiograph dated 04/06/2021. FINDINGS: No focal consolidation, pleural effusion or pneumothorax. The cardiac silhouette is within normal limits. No acute osseous pathology. IMPRESSION: No active disease. Electronically Signed   By: Elgie Collard M.D.   On: 06/02/2022 19:22   CT CHEST LUNG CA SCREEN LOW DOSE W/O CM  Result Date: 05/23/2022 CLINICAL DATA:  51 year old asymptomatic female current smoker 20 pack-year smoking history. EXAM: CT CHEST WITHOUT CONTRAST LOW-DOSE FOR LUNG CANCER SCREENING TECHNIQUE: Multidetector CT imaging of the chest was performed following the standard protocol without IV contrast. RADIATION DOSE REDUCTION: This exam was performed according to the departmental dose-optimization program which includes automated exposure control, adjustment of the mA and/or kV according to patient size and/or use of iterative reconstruction technique. COMPARISON:  04/06/2021 chest CT angiogram. FINDINGS: Cardiovascular: Normal heart size. Trace anterior pericardial effusion. Mildly atherosclerotic nonaneurysmal thoracic aorta. Normal caliber pulmonary arteries. Mediastinum/Nodes: No significant thyroid nodules. Unremarkable esophagus. Newly mildly enlarged 1.1 cm right axillary lymph node (series 2/image 16). No left axillary adenopathy. No pathologically enlarged mediastinal or discrete hilar nodes on these noncontrast images. Lungs/Pleura: No pneumothorax. No pleural effusion. Mild paraseptal  and centrilobular emphysema with diffuse bronchial wall thickening. No acute consolidative airspace disease or lung masses. Posterior left upper lobe solid subpleural triangular opacity measuring 4 mm in volume derived mean diameter (series 3/image 68). Otherwise no significant pulmonary nodules. Curvilinear nonspecific postinfectious/postinflammatory scarring in the peripheral anterior left lower lobe and dependent basilar right lower lobe. Upper abdomen: Small hiatal hernia. Musculoskeletal: No aggressive appearing focal osseous lesions. Mild thoracic spondylosis. IMPRESSION: 1. Lung-RADS 2-S, benign appearance or behavior. Continue annual screening with low-dose chest CT without contrast in 12 months. 2. The "S" modifier above refers to potentially clinically significant non lung cancer related findings. Specifically, newly mildly enlarged right axillary lymph node, nonspecific. Suggest diagnostic mammographic evaluation at this time. 3. Small hiatal hernia. 4. Aortic Atherosclerosis (ICD10-I70.0) and Emphysema (ICD10-J43.9). These results will be called to the ordering clinician or representative by the Radiologist Assistant, and communication documented in the PACS or [employer]. Electronically Signed   By: Delbert Phenix M.D.   On: 05/23/2022 16:20     Subjective: Seen and examined on day of discharge.  Stable no distress.  Husband at bedside.  Wheezing resolved.  Stable for DC home.  Discharge Exam: Vitals:   06/04/22 7425  06/04/22 0813  BP:  131/76  Pulse: 94 (!) 106  Resp: 16 18  Temp:  97.8 F (36.6 C)  SpO2: 95% 94%   Vitals:   06/03/22 2107 06/03/22 2334 06/04/22 0728 06/04/22 0813  BP: 103/71   131/76  Pulse: (!) 105 97 94 (!) 106  Resp: 20  16 18   Temp: 98.7 F (37.1 C)   97.8 F (36.6 C)  TempSrc:    Oral  SpO2: 95% 96% 95% 94%  Weight:      Height:        General: Pt is alert, awake, not in acute distress Cardiovascular: RRR, S1/S2 +, no rubs, no  gallops Respiratory: CTA bilaterally, no wheezing, no rhonchi Abdominal: Soft, NT, ND, bowel sounds + Extremities: no edema, no cyanosis    The results of significant diagnostics from this hospitalization (including imaging, microbiology, ancillary and laboratory) are listed below for reference.     Microbiology: Recent Results (from the past 240 hour(s))  Resp Panel by RT-PCR (Flu A&B, Covid) Anterior Nasal Swab     Status: None   Collection Time: 06/02/22  8:31 PM   Specimen: Anterior Nasal Swab  Result Value Ref Range Status   SARS Coronavirus 2 by RT PCR NEGATIVE NEGATIVE Final    Comment: (NOTE) SARS-CoV-2 target nucleic acids are NOT DETECTED.  The SARS-CoV-2 RNA is generally detectable in upper respiratory specimens during the acute phase of infection. The lowest concentration of SARS-CoV-2 viral copies this assay can detect is 138 copies/mL. A negative result does not preclude SARS-Cov-2 infection and should not be used as the sole basis for treatment or other patient management decisions. A negative result may occur with  improper specimen collection/handling, submission of specimen other than nasopharyngeal swab, presence of viral mutation(s) within the areas targeted by this assay, and inadequate number of viral copies(<138 copies/mL). A negative result must be combined with clinical observations, patient history, and epidemiological information. The expected result is Negative.  Fact Sheet for Patients:  EntrepreneurPulse.com.au  Fact Sheet for Healthcare Providers:  IncredibleEmployment.be  This test is no t yet approved or cleared by the [place] FDA and  has been authorized for detection and/or diagnosis of SARS-CoV-2 by FDA under an Emergency Use Authorization (EUA). This EUA will remain  in effect (meaning this test can be used) for the duration of the COVID-19 declaration under Section 564(b)(1) of the Act,  21 U.S.C.section 360bbb-3(b)(1), unless the authorization is terminated  or revoked sooner.       Influenza A by PCR NEGATIVE NEGATIVE Final   Influenza B by PCR NEGATIVE NEGATIVE Final    Comment: (NOTE) The Xpert Xpress SARS-CoV-2/FLU/RSV plus assay is intended as an aid in the diagnosis of influenza from Nasopharyngeal swab specimens and should not be used as a sole basis for treatment. Nasal washings and aspirates are unacceptable for Xpert Xpress SARS-CoV-2/FLU/RSV testing.  Fact Sheet for Patients: EntrepreneurPulse.com.au  Fact Sheet for Healthcare Providers: IncredibleEmployment.be  This test is not yet approved or cleared by the [place] FDA and has been authorized for detection and/or diagnosis of SARS-CoV-2 by FDA under an Emergency Use Authorization (EUA). This EUA will remain in effect (meaning this test can be used) for the duration of the COVID-19 declaration under Section 564(b)(1) of the Act, 21 U.S.C. section 360bbb-3(b)(1), unless the authorization is terminated or revoked.  Performed at [facility], [address] [place], [place] [address]      Labs: BNP (last 3 results) Recent  Labs    06/02/22 2031  BNP 34.4   Basic Metabolic Panel: Recent Labs  Lab 06/02/22 1848 06/03/22 0403 06/04/22 0811  NA 137 139 140  K 3.9 4.1 3.7  CL 104 108 108  CO2 26 26 25   GLUCOSE 184* 154* 107*  BUN 10 16 20   CREATININE 0.75 0.92 0.84  CALCIUM 9.5 8.6* 9.3   Liver Function Tests: Recent Labs  Lab 06/02/22 1848  AST 17  ALT 13  ALKPHOS 156*  BILITOT 0.8  PROT 8.5*  ALBUMIN 4.2   No results for input(s): "LIPASE", "AMYLASE" in the last 168 hours. No results for input(s): "AMMONIA" in the last 168 hours. CBC: Recent Labs  Lab 06/02/22 1848 06/03/22 0403 06/04/22 0811  WBC 13.7* 12.2* 15.4*  NEUTROABS 9.3*  --  11.8*  HGB 14.6 12.4 12.3  HCT 45.8 38.4 37.8  MCV 92.3 92.3 91.1  PLT 309 269  290   Cardiac Enzymes: No results for input(s): "CKTOTAL", "CKMB", "CKMBINDEX", "TROPONINI" in the last 168 hours. BNP: Invalid input(s): "POCBNP" CBG: No results for input(s): "GLUCAP" in the last 168 hours. D-Dimer Recent Labs    06/02/22 2158  DDIMER 0.36   Hgb A1c No results for input(s): "HGBA1C" in the last 72 hours. Lipid Profile No results for input(s): "CHOL", "HDL", "LDLCALC", "TRIG", "CHOLHDL", "LDLDIRECT" in the last 72 hours. Thyroid function studies No results for input(s): "TSH", "T4TOTAL", "T3FREE", "THYROIDAB" in the last 72 hours.  Invalid input(s): "FREET3" Anemia work up No results for input(s): "VITAMINB12", "FOLATE", "FERRITIN", "TIBC", "IRON", "RETICCTPCT" in the last 72 hours. Urinalysis    Component Value Date/Time   COLORURINE YELLOW (A) 04/06/2021 1518   APPEARANCEUR CLEAR (A) 04/06/2021 1518   LABSPEC >1.046 (H) 04/06/2021 1518   PHURINE 5.0 04/06/2021 1518   GLUCOSEU NEGATIVE 04/06/2021 1518   HGBUR MODERATE (A) 04/06/2021 1518   BILIRUBINUR NEGATIVE 04/06/2021 1518   KETONESUR 5 (A) 04/06/2021 1518   PROTEINUR NEGATIVE 04/06/2021 1518   NITRITE NEGATIVE 04/06/2021 1518   LEUKOCYTESUR NEGATIVE 04/06/2021 1518   Sepsis Labs Recent Labs  Lab 06/02/22 1848 06/03/22 0403 06/04/22 0811  WBC 13.7* 12.2* 15.4*   Microbiology Recent Results (from the past 240 hour(s))  Resp Panel by RT-PCR (Flu A&B, Covid) Anterior Nasal Swab     Status: None   Collection Time: 06/02/22  8:31 PM   Specimen: Anterior Nasal Swab  Result Value Ref Range Status   SARS Coronavirus 2 by RT PCR NEGATIVE NEGATIVE Final    Comment: (NOTE) SARS-CoV-2 target nucleic acids are NOT DETECTED.  The SARS-CoV-2 RNA is generally detectable in upper respiratory specimens during the acute phase of infection. The lowest concentration of SARS-CoV-2 viral copies this assay can detect is 138 copies/mL. A negative result does not preclude SARS-Cov-2 infection and should not be  used as the sole basis for treatment or other patient management decisions. A negative result may occur with  improper specimen collection/handling, submission of specimen other than nasopharyngeal swab, presence of viral mutation(s) within the areas targeted by this assay, and inadequate number of viral copies(<138 copies/mL). A negative result must be combined with clinical observations, patient history, and epidemiological information. The expected result is Negative.  Fact Sheet for Patients:  BloggerCourse.comhttps://www.fda.gov/media/152166/download  Fact Sheet for Healthcare Providers:  SeriousBroker.ithttps://www.fda.gov/media/152162/download  This test is no t yet approved or cleared by the [place] FDA and  has been authorized for detection and/or diagnosis of SARS-CoV-2 by FDA under an Emergency Use Authorization (EUA). This EUA  will remain  in effect (meaning this test can be used) for the duration of the COVID-19 declaration under Section 564(b)(1) of the Act, 21 U.S.C.section 360bbb-3(b)(1), unless the authorization is terminated  or revoked sooner.       Influenza A by PCR NEGATIVE NEGATIVE Final   Influenza B by PCR NEGATIVE NEGATIVE Final    Comment: (NOTE) The Xpert Xpress SARS-CoV-2/FLU/RSV plus assay is intended as an aid in the diagnosis of influenza from Nasopharyngeal swab specimens and should not be used as a sole basis for treatment. Nasal washings and aspirates are unacceptable for Xpert Xpress SARS-CoV-2/FLU/RSV testing.  Fact Sheet for Patients: BloggerCourse.com  Fact Sheet for Healthcare Providers: SeriousBroker.it  This test is not yet approved or cleared by the [place] FDA and has been authorized for detection and/or diagnosis of SARS-CoV-2 by FDA under an Emergency Use Authorization (EUA). This EUA will remain in effect (meaning this test can be used) for the duration of the COVID-19 declaration under Section  564(b)(1) of the Act, 21 U.S.C. section 360bbb-3(b)(1), unless the authorization is terminated or revoked.  Performed at [facility], [address], [place], [place] [address]      Time coordinating discharge: Over 30 minutes  SIGNED:   Tresa Moore, MD  [facility] 06/04/2022, 1:06 PM Pager   If 7PM-7AM, please contact night-coverage

## 2023-03-08 ENCOUNTER — Other Ambulatory Visit: Payer: Self-pay | Admitting: Acute Care

## 2023-03-08 DIAGNOSIS — F17210 Nicotine dependence, cigarettes, uncomplicated: Secondary | ICD-10-CM

## 2023-03-08 DIAGNOSIS — Z87891 Personal history of nicotine dependence: Secondary | ICD-10-CM

## 2023-03-08 DIAGNOSIS — Z122 Encounter for screening for malignant neoplasm of respiratory organs: Secondary | ICD-10-CM

## 2023-05-26 ENCOUNTER — Ambulatory Visit: Payer: BC Managed Care – PPO

## 2023-06-13 ENCOUNTER — Emergency Department: Payer: BC Managed Care – PPO

## 2023-06-13 ENCOUNTER — Inpatient Hospital Stay
Admission: EM | Admit: 2023-06-13 | Discharge: 2023-06-16 | DRG: 871 | Disposition: A | Discharge status: Home / Self Care | Payer: BC Managed Care – PPO | Attending: Internal Medicine | Admitting: Internal Medicine

## 2023-06-13 ENCOUNTER — Other Ambulatory Visit: Payer: Self-pay

## 2023-06-13 DIAGNOSIS — J189 Pneumonia, unspecified organism: Secondary | ICD-10-CM | POA: Diagnosis present

## 2023-06-13 DIAGNOSIS — J441 Chronic obstructive pulmonary disease with (acute) exacerbation: Secondary | ICD-10-CM | POA: Diagnosis present

## 2023-06-13 DIAGNOSIS — J45901 Unspecified asthma with (acute) exacerbation: Secondary | ICD-10-CM | POA: Diagnosis present

## 2023-06-13 DIAGNOSIS — J9601 Acute respiratory failure with hypoxia: Principal | ICD-10-CM | POA: Diagnosis present

## 2023-06-13 DIAGNOSIS — J440 Chronic obstructive pulmonary disease with acute lower respiratory infection: Secondary | ICD-10-CM | POA: Diagnosis present

## 2023-06-13 DIAGNOSIS — Z87891 Personal history of nicotine dependence: Secondary | ICD-10-CM

## 2023-06-13 DIAGNOSIS — Z6841 Body Mass Index (BMI) 40.0 and over, adult: Secondary | ICD-10-CM

## 2023-06-13 DIAGNOSIS — A419 Sepsis, unspecified organism: Principal | ICD-10-CM | POA: Diagnosis present

## 2023-06-13 DIAGNOSIS — Z1152 Encounter for screening for COVID-19: Secondary | ICD-10-CM

## 2023-06-13 DIAGNOSIS — Z803 Family history of malignant neoplasm of breast: Secondary | ICD-10-CM

## 2023-06-13 DIAGNOSIS — R6521 Severe sepsis with septic shock: Secondary | ICD-10-CM | POA: Diagnosis present

## 2023-06-13 DIAGNOSIS — R6520 Severe sepsis without septic shock: Secondary | ICD-10-CM

## 2023-06-13 DIAGNOSIS — E871 Hypo-osmolality and hyponatremia: Secondary | ICD-10-CM | POA: Diagnosis present

## 2023-06-13 DIAGNOSIS — E872 Acidosis, unspecified: Secondary | ICD-10-CM | POA: Diagnosis present

## 2023-06-13 DIAGNOSIS — G4733 Obstructive sleep apnea (adult) (pediatric): Secondary | ICD-10-CM

## 2023-06-13 DIAGNOSIS — F32A Depression, unspecified: Secondary | ICD-10-CM | POA: Diagnosis present

## 2023-06-13 DIAGNOSIS — Z7982 Long term (current) use of aspirin: Secondary | ICD-10-CM

## 2023-06-13 DIAGNOSIS — J9600 Acute respiratory failure, unspecified whether with hypoxia or hypercapnia: Secondary | ICD-10-CM | POA: Diagnosis not present

## 2023-06-13 MED ORDER — IPRATROPIUM-ALBUTEROL 0.5-2.5 (3) MG/3ML IN SOLN
3.0000 mL | Freq: Once | RESPIRATORY_TRACT | Status: AC
  Administered 2023-06-14: 3 mL via RESPIRATORY_TRACT
  Filled 2023-06-13: qty 3

## 2023-06-13 MED ORDER — METHYLPREDNISOLONE SODIUM SUCC 125 MG IJ SOLR
125.0000 mg | Freq: Once | INTRAMUSCULAR | Status: AC
  Administered 2023-06-14: 125 mg via INTRAVENOUS
  Filled 2023-06-13: qty 2

## 2023-06-13 NOTE — ED Provider Notes (Signed)
[facility] Provider Note    Event Date/Time   First MD Initiated Contact with Patient 06/13/23 2256     (approximate)   History   Shortness of Breath  Level 5 caveat:  history/ROS limited by acute/critical illness  HPI Nicole Mason is a 52 y.o. female who presents for evaluation of worsening breathing over the last several days.  She said that she has had subjective fevers and some bodyaches.  She then started develop shortness of breath and she has a history of both COPD and asthma.  She does not use supplemental oxygen.  When she arrived in the emergency department because her breathing was severe, she was satting in the 80s requiring 4 L of oxygen and despite the 4 L she was still at about 90% with increased work of breathing.  She said she has had a productive cough.  Her chest hurts in general from coughing.     Physical Exam   Triage Vital Signs: ED Triage Vitals  Encounter Vitals Group     BP 06/13/23 2228 134/87     Systolic BP Percentile --      Diastolic BP Percentile --      Pulse Rate 06/13/23 2229 (!) 111     Resp 06/13/23 2228 18     Temp 06/13/23 2228 98.2 F (36.8 C)     Temp Source 06/13/23 2228 Oral     SpO2 06/13/23 2228 (!) 89 %     Weight --      Height --      Head Circumference --      Peak Flow --      Pain Score 06/13/23 2228 0     Pain Loc --      Pain Education --      Exclude from Growth Chart --     Most recent vital signs: Vitals:   06/14/23 0315 06/14/23 0400  BP:  (!) 148/83  Pulse: (!) 132 (!) 126  Resp: (!) 24 (!) 22  Temp:    SpO2: 97% 97%    General: Awake, mild to moderate respiratory distress. CV:  Good peripheral perfusion.  Tachycardia, regular rhythm. Resp:  Increased respiratory effort with accessory muscle usage and intercostal retractions.  Coarse breath sounds throughout with expiratory wheezing. Abd:  No distention.    ED Results / Procedures / Treatments   Labs (all labs  ordered are listed, but only abnormal results are displayed) Labs Reviewed  LACTIC ACID, PLASMA - Abnormal; Notable for the following components:      Result Value   Lactic Acid, Venous 2.5 (*)    All other components within normal limits  LACTIC ACID, PLASMA - Abnormal; Notable for the following components:   Lactic Acid, Venous 7.3 (*)    All other components within normal limits  COMPREHENSIVE METABOLIC PANEL - Abnormal; Notable for the following components:   Sodium 133 (*)    Glucose, Bld 164 (*)    Total Protein 8.2 (*)    Alkaline Phosphatase 138 (*)    All other components within normal limits  CBC WITH DIFFERENTIAL/PLATELET - Abnormal; Notable for the following components:   WBC 16.7 (*)    Neutro Abs 16.0 (*)    Lymphs Abs 0.4 (*)    Abs Immature Granulocytes 0.11 (*)    All other components within normal limits  BLOOD GAS, VENOUS - Abnormal; Notable for the following components:   pCO2, Ven 38 (*)  pO2, Ven 52 (*)    All other components within normal limits  URINALYSIS, W/ REFLEX TO CULTURE (INFECTION SUSPECTED) - Abnormal; Notable for the following components:   Color, Urine COLORLESS (*)    APPearance CLEAR (*)    Glucose, UA >=500 (*)    Bacteria, UA RARE (*)    All other components within normal limits  MAGNESIUM - Abnormal; Notable for the following components:   Magnesium 1.4 (*)    All other components within normal limits  RESP PANEL BY RT-PCR (RSV, FLU A&B, COVID)  [id number]  CULTURE, BLOOD (ROUTINE X 2)  CULTURE, BLOOD (ROUTINE X 2)  PROTIME-INR  APTT  LIPASE, BLOOD  BRAIN NATRIURETIC PEPTIDE  LACTIC ACID, PLASMA     EKG  ED ECG REPORT I, Loleta Rose, the attending physician, personally viewed and interpreted this ECG.  Date: 06/13/2023 EKG Time: 23: 30 Rate: 105 Rhythm: Sinus tachycardia QRS Axis: normal Intervals: normal ST/T Wave abnormalities: normal Narrative Interpretation: no evidence of acute ischemia    RADIOLOGY I viewed  and interpreted the patient's 1 view chest x-ray and I see no obvious sign of lobar pneumonia or pulmonary edema.  I also read the radiologist's report, which confirmed no acute findings.  I also viewed and interpreted her CTA chest.  See hospital course for details.   PROCEDURES:  Critical Care performed: Yes, see critical care procedure note(s)  .Critical Care  Performed by: Loleta Rose, MD Authorized by: Loleta Rose, MD   Critical care provider statement:    Critical care time (minutes):  60   Critical care time was exclusive of:  Separately billable procedures and treating other patients   Critical care was necessary to treat or prevent imminent or life-threatening deterioration of the following conditions:  Respiratory failure and sepsis   Critical care was time spent personally by me on the following activities:  Development of treatment plan with patient or surrogate, evaluation of patient's response to treatment, examination of patient, obtaining history from patient or surrogate, ordering and performing treatments and interventions, ordering and review of laboratory studies, ordering and review of radiographic studies, pulse oximetry, re-evaluation of patient's condition and review of old charts     IMPRESSION / MDM / ASSESSMENT AND PLAN / ED COURSE  I reviewed the triage vital signs and the nursing notes.                              Differential diagnosis includes, but is not limited to, COPD exacerbation and/or asthma exacerbation, community-acquired pneumonia, PE.  Patient's presentation is most consistent with acute presentation with potential threat to life or bodily function.  Labs/studies ordered: I ordered standard sepsis labs including the following: COVID-19 PCR swab, blood cultures x2, pro time-INR, CMP, urinalysis, urine culture, lactic acid, APTT, CBC with differential, lipase.  Also ordered BNP to evaluate for the possibility of new onset CHF.  Also ordered  chest x-ray and CTA chest  Interventions/Medications given:  Medications  cefTRIAXone (ROCEPHIN) 2 g in sodium chloride 0.9 % 100 mL IVPB (0 g Intravenous Stopped 06/14/23 0235)  azithromycin (ZITHROMAX) 500 mg in dextrose 5 % 250 mL IVPB (0 mg Intravenous Stopped 06/14/23 0235)  ipratropium-albuterol (DUONEB) 0.5-2.5 (3) MG/3ML nebulizer solution 3 mL (3 mLs Nebulization Given 06/14/23 0006)  ipratropium-albuterol (DUONEB) 0.5-2.5 (3) MG/3ML nebulizer solution 3 mL (3 mLs Nebulization Given 06/14/23 0006)  ipratropium-albuterol (DUONEB) 0.5-2.5 (3) MG/3ML nebulizer solution 3 mL (3 mLs  Nebulization Given 06/14/23 0006)  methylPREDNISolone sodium succinate (SOLU-MEDROL) 125 mg/2 mL injection 125 mg (125 mg Intravenous Given 06/14/23 0003)  lactated ringers bolus 1,000 mL (1,000 mLs Intravenous New Bag/Given 06/14/23 0239)  albuterol (PROVENTIL) (2.5 MG/3ML) 0.083% nebulizer solution 5 mg (5 mg Nebulization Given 06/14/23 0157)  lactated ringers bolus 1,000 mL (0 mLs Intravenous Stopped 06/14/23 0234)    And  lactated ringers bolus 500 mL (0 mLs Intravenous Stopped 06/14/23 0311)  iohexol (OMNIPAQUE) 350 MG/ML injection 100 mL (100 mLs Intravenous Contrast Given 06/14/23 0153)  albuterol (PROVENTIL) (2.5 MG/3ML) 0.083% nebulizer solution 5 mg (5 mg Nebulization Given 06/14/23 0337)    (Note:  hospital course my include additional interventions and/or labs/studies not listed above.)   Suspect viral illness causing acute COPD/asthma exacerbation.  Patient does not at this time require BiPAP but definitely require supplemental oxygen due to hypoxia.  I am initiating a "possible sepsis" workup as documented above.  The patient is on the cardiac monitor to evaluate for evidence of arrhythmia and/or significant heart rate changes.  Urinalysis is normal, respiratory viral panel negative, no hypercapnia on VBG.   Clinical Course as of 06/14/23 0430  Sat Jun 14, 2023  0116 Lactic elevated at 2.5 but I  suspect this is due to respiratory distress/failure rather than indicative of sepsis.  However, she also has a leukocytosis of 16.7 and tachycardia so she meets sepsis criteria.  I will make code sepsis and treat empirically with IV antibiotics for probable community-acquired pneumonia even though her chest x-ray is clear.  Also ordering 30 mL/kg of IV fluids given the concern for severe sepsis.  Also ordering magnesium 2 g IV given her hypomagnesemia and the hope it will help with her respiratory distress. [CF]  0236 CT Angio Chest PE W/Cm &/Or Wo Cm I viewed and interpreted the patient's CTA chest.  No evidence of pulmonary embolism but she has pneumonia in the right lower lobe.  Radiology report concurs.  I am admitting the patient for acute respiratory failure with hypoxia in the setting of COPD exacerbation and right lower lobe pneumonia [CF]  0252 I consulted Dr. Para March with the hospitalist service who will admit the patient.  I also reassessed the patient to let her know about the results and the plan, and she is actually worsening in terms of increased respiratory effort.  I ordered BiPAP and let Dr. Para March know [CF]  [phone] Sepsis reassessment completed.  Blood pressure is stable but she remains tachycardic but is also continually getting breathing treatments.  She is still in moderate respiratory distress but better after starting the BiPAP.  She is able to speak with me while breathing.  I am ordering another 5 mg of albuterol. [CF]    Clinical Course User Index [CF] Loleta Rose, MD     FINAL CLINICAL IMPRESSION(S) / ED DIAGNOSES   Final diagnoses:  Acute respiratory failure with hypoxia (HCC)  Pneumonia of right lower lobe due to infectious organism  Severe sepsis [facility])     Rx / DC Orders   ED Discharge Orders     None        Note:  This document was prepared using Dragon voice recognition software and may include unintentional dictation errors.   Loleta Rose,  MD 06/14/23 0430

## 2023-06-13 NOTE — ED Notes (Addendum)
Patient to ED waiting room via wheelchair by EMS from home.  Per EMS patient with complaint of breathing difficulty for 4 days.  EMS interventions -- 2 albuterol svn, solumedral 125mg  (iv),  pulse oxi 86% on room air, patient on O2 at 3 liters post treatment.  163/113, 114, pulse ox 96% on 3 liters, CBG 132, saline loc via 22 g angiocath to left hand.

## 2023-06-13 NOTE — ED Triage Notes (Signed)
Pt reports hx of COPD and emphysema. No home O2.  Is requiring 4L at time of triage.  Pt sats still at 90%.  Has had productive cough of white sputum. No fever or chills.  Symptoms worsening the past few days.

## 2023-06-14 ENCOUNTER — Emergency Department: Payer: BC Managed Care – PPO

## 2023-06-14 DIAGNOSIS — G4733 Obstructive sleep apnea (adult) (pediatric): Secondary | ICD-10-CM | POA: Diagnosis present

## 2023-06-14 DIAGNOSIS — J9600 Acute respiratory failure, unspecified whether with hypoxia or hypercapnia: Secondary | ICD-10-CM | POA: Diagnosis present

## 2023-06-14 DIAGNOSIS — Z6841 Body Mass Index (BMI) 40.0 and over, adult: Secondary | ICD-10-CM | POA: Diagnosis not present

## 2023-06-14 DIAGNOSIS — R6521 Severe sepsis with septic shock: Secondary | ICD-10-CM | POA: Diagnosis present

## 2023-06-14 DIAGNOSIS — E872 Acidosis, unspecified: Secondary | ICD-10-CM | POA: Diagnosis present

## 2023-06-14 DIAGNOSIS — Z7982 Long term (current) use of aspirin: Secondary | ICD-10-CM | POA: Diagnosis not present

## 2023-06-14 DIAGNOSIS — J45901 Unspecified asthma with (acute) exacerbation: Secondary | ICD-10-CM | POA: Diagnosis present

## 2023-06-14 DIAGNOSIS — J441 Chronic obstructive pulmonary disease with (acute) exacerbation: Secondary | ICD-10-CM | POA: Diagnosis present

## 2023-06-14 DIAGNOSIS — Z1152 Encounter for screening for COVID-19: Secondary | ICD-10-CM | POA: Diagnosis not present

## 2023-06-14 DIAGNOSIS — F32A Depression, unspecified: Secondary | ICD-10-CM | POA: Diagnosis present

## 2023-06-14 DIAGNOSIS — J9601 Acute respiratory failure with hypoxia: Secondary | ICD-10-CM | POA: Diagnosis present

## 2023-06-14 DIAGNOSIS — E871 Hypo-osmolality and hyponatremia: Secondary | ICD-10-CM | POA: Diagnosis present

## 2023-06-14 DIAGNOSIS — A419 Sepsis, unspecified organism: Secondary | ICD-10-CM | POA: Insufficient documentation

## 2023-06-14 DIAGNOSIS — J189 Pneumonia, unspecified organism: Secondary | ICD-10-CM | POA: Diagnosis present

## 2023-06-14 DIAGNOSIS — J440 Chronic obstructive pulmonary disease with acute lower respiratory infection: Secondary | ICD-10-CM | POA: Diagnosis present

## 2023-06-14 DIAGNOSIS — Z87891 Personal history of nicotine dependence: Secondary | ICD-10-CM | POA: Diagnosis not present

## 2023-06-14 DIAGNOSIS — Z803 Family history of malignant neoplasm of breast: Secondary | ICD-10-CM | POA: Diagnosis not present

## 2023-06-14 LAB — URINALYSIS, W/ REFLEX TO CULTURE (INFECTION SUSPECTED)
Bilirubin Urine: NEGATIVE
Glucose, UA: >=500 mg/dL — AB
Hgb urine dipstick: NEGATIVE
Ketones, ur: NEGATIVE mg/dL
Leukocytes,Ua: NEGATIVE
Nitrite: NEGATIVE
Protein, ur: NEGATIVE mg/dL
Specific Gravity, Urine: 1.015 (ref 1.005–1.030)
pH: 5 (ref 5.0–8.0)

## 2023-06-14 LAB — BLOOD GAS, VENOUS
Acid-Base Excess: 0.3 mmol/L (ref 0.0–2.0)
Bicarbonate: 24.6 mmol/L (ref 20.0–28.0)
O2 Saturation: 85.1 %
Patient temperature: 37
pCO2, Ven: 38 mm[Hg] — ABNORMAL LOW (ref 44–60)
pH, Ven: 7.42 (ref 7.25–7.43)
pO2, Ven: 52 mm[Hg] — ABNORMAL HIGH (ref 32–45)

## 2023-06-14 LAB — CBC
HCT: 40.6 % (ref 36.0–46.0)
Hemoglobin: 13.6 g/dL (ref 12.0–15.0)
MCH: 31.1 pg (ref 26.0–34.0)
MCHC: 33.5 g/dL (ref 30.0–36.0)
MCV: 92.7 fL (ref 80.0–100.0)
Platelets: 173 10*3/uL (ref 150–400)
RBC: 4.38 MIL/uL (ref 3.87–5.11)
RDW: 12.9 % (ref 11.5–15.5)
WBC: 16.5 10*3/uL — ABNORMAL HIGH (ref 4.0–10.5)
nRBC: 0 % (ref 0.0–0.2)

## 2023-06-14 LAB — RESP PANEL BY RT-PCR (RSV, FLU A&B, COVID)  RVPGX2
Influenza A by PCR: NEGATIVE
Influenza B by PCR: NEGATIVE
Resp Syncytial Virus by PCR: NEGATIVE
SARS Coronavirus 2 by RT PCR: NEGATIVE

## 2023-06-14 LAB — CBC WITH DIFFERENTIAL/PLATELET
Abs Immature Granulocytes: 0.11 10*3/uL — ABNORMAL HIGH (ref 0.00–0.07)
Basophils Absolute: 0 10*3/uL (ref 0.0–0.1)
Basophils Relative: 0 %
Eosinophils Absolute: 0 10*3/uL (ref 0.0–0.5)
Eosinophils Relative: 0 %
HCT: 43.6 % (ref 36.0–46.0)
Hemoglobin: 14.4 g/dL (ref 12.0–15.0)
Immature Granulocytes: 1 %
Lymphocytes Relative: 3 %
Lymphs Abs: 0.4 10*3/uL — ABNORMAL LOW (ref 0.7–4.0)
MCH: 30.1 pg (ref 26.0–34.0)
MCHC: 33 g/dL (ref 30.0–36.0)
MCV: 91.2 fL (ref 80.0–100.0)
Monocytes Absolute: 0.2 10*3/uL (ref 0.1–1.0)
Monocytes Relative: 1 %
Neutro Abs: 16 10*3/uL — ABNORMAL HIGH (ref 1.7–7.7)
Neutrophils Relative %: 95 %
Platelets: 258 10*3/uL (ref 150–400)
RBC: 4.78 MIL/uL (ref 3.87–5.11)
RDW: 12.9 % (ref 11.5–15.5)
WBC: 16.7 10*3/uL — ABNORMAL HIGH (ref 4.0–10.5)
nRBC: 0 % (ref 0.0–0.2)

## 2023-06-14 LAB — BASIC METABOLIC PANEL
Anion gap: 13 (ref 5–15)
BUN: 7 mg/dL (ref 6–20)
CO2: 20 mmol/L — ABNORMAL LOW (ref 22–32)
Calcium: 9.7 mg/dL (ref 8.9–10.3)
Chloride: 104 mmol/L (ref 98–111)
Creatinine, Ser: 0.75 mg/dL (ref 0.44–1.00)
GFR, Estimated: >60 mL/min (ref >60)
Glucose, Bld: 203 mg/dL — ABNORMAL HIGH (ref 70–99)
Potassium: 3.7 mmol/L (ref 3.5–5.1)
Sodium: 137 mmol/L (ref 135–145)

## 2023-06-14 LAB — BRAIN NATRIURETIC PEPTIDE: B Natriuretic Peptide: 38.7 pg/mL (ref 0.0–100.0)

## 2023-06-14 LAB — APTT: aPTT: 26 s (ref 24–36)

## 2023-06-14 LAB — CREATININE, SERUM
Creatinine, Ser: 0.8 mg/dL (ref 0.44–1.00)
GFR, Estimated: >60 mL/min (ref >60)

## 2023-06-14 LAB — PROTIME-INR
INR: 1 (ref 0.8–1.2)
Prothrombin Time: 13.6 s (ref 11.4–15.2)

## 2023-06-14 LAB — LACTIC ACID, PLASMA
Lactic Acid, Venous: 2.3 mmol/L (ref 0.5–1.9)
Lactic Acid, Venous: 2.5 mmol/L (ref 0.5–1.9)
Lactic Acid, Venous: 5.6 mmol/L (ref 0.5–1.9)
Lactic Acid, Venous: 7.3 mmol/L (ref 0.5–1.9)

## 2023-06-14 LAB — COMPREHENSIVE METABOLIC PANEL
ALT: 30 U/L (ref 0–44)
AST: 32 U/L (ref 15–41)
Albumin: 4.6 g/dL (ref 3.5–5.0)
Alkaline Phosphatase: 138 U/L — ABNORMAL HIGH (ref 38–126)
Anion gap: 11 (ref 5–15)
BUN: 7 mg/dL (ref 6–20)
CO2: 22 mmol/L (ref 22–32)
Calcium: 9.4 mg/dL (ref 8.9–10.3)
Chloride: 100 mmol/L (ref 98–111)
Creatinine, Ser: 0.73 mg/dL (ref 0.44–1.00)
GFR, Estimated: >60 mL/min (ref >60)
Glucose, Bld: 164 mg/dL — ABNORMAL HIGH (ref 70–99)
Potassium: 3.9 mmol/L (ref 3.5–5.1)
Sodium: 133 mmol/L — ABNORMAL LOW (ref 135–145)
Total Bilirubin: 0.9 mg/dL (ref 0.3–1.2)
Total Protein: 8.2 g/dL — ABNORMAL HIGH (ref 6.5–8.1)

## 2023-06-14 LAB — MAGNESIUM
Magnesium: 1.4 mg/dL — ABNORMAL LOW (ref 1.7–2.4)
Magnesium: 1.9 mg/dL (ref 1.7–2.4)

## 2023-06-14 LAB — GLUCOSE, CAPILLARY: Glucose-Capillary: 154 mg/dL — ABNORMAL HIGH (ref 70–99)

## 2023-06-14 LAB — MRSA NEXT GEN BY PCR, NASAL: MRSA by PCR Next Gen: NOT DETECTED

## 2023-06-14 LAB — HIV ANTIBODY (ROUTINE TESTING W REFLEX): HIV Screen 4th Generation wRfx: NONREACTIVE

## 2023-06-14 LAB — LIPASE, BLOOD: Lipase: 25 U/L (ref 11–51)

## 2023-06-14 MED ORDER — ALBUTEROL SULFATE (2.5 MG/3ML) 0.083% IN NEBU
5.0000 mg | INHALATION_SOLUTION | Freq: Once | RESPIRATORY_TRACT | Status: AC
  Administered 2023-06-14: 5 mg via RESPIRATORY_TRACT
  Filled 2023-06-14: qty 6

## 2023-06-14 MED ORDER — ONDANSETRON HCL 4 MG/2ML IJ SOLN: 4.0000 mg | Freq: Four times a day (QID) | INTRAMUSCULAR | Status: DC | PRN

## 2023-06-14 MED ORDER — DEXTROSE-SODIUM CHLORIDE 5-0.45 % IV SOLN: INTRAVENOUS | Status: DC

## 2023-06-14 MED ORDER — PREDNISONE 20 MG PO TABS
40.0000 mg | ORAL_TABLET | Freq: Every day | ORAL | Status: DC
  Administered 2023-06-15 – 2023-06-16 (×2): 40 mg via ORAL
  Filled 2023-06-14 (×2): qty 2

## 2023-06-14 MED ORDER — ONDANSETRON HCL 4 MG PO TABS: 4.0000 mg | ORAL_TABLET | Freq: Four times a day (QID) | ORAL | Status: DC | PRN

## 2023-06-14 MED ORDER — GUAIFENESIN ER 600 MG PO TB12
600.0000 mg | ORAL_TABLET | Freq: Two times a day (BID) | ORAL | Status: DC
  Administered 2023-06-14 – 2023-06-16 (×5): 600 mg via ORAL
  Filled 2023-06-14 (×5): qty 1

## 2023-06-14 MED ORDER — SODIUM CHLORIDE 0.9 % IV SOLN
2.0000 g | INTRAVENOUS | Status: DC
  Administered 2023-06-14 – 2023-06-16 (×3): 2 g via INTRAVENOUS
  Filled 2023-06-14 (×3): qty 20

## 2023-06-14 MED ORDER — DEXTROSE 5 % IV SOLN: 500.0000 mg | INTRAVENOUS | Status: DC

## 2023-06-14 MED ORDER — HYDROCODONE-ACETAMINOPHEN 5-325 MG PO TABS
1.0000 | ORAL_TABLET | ORAL | Status: DC | PRN
Start: 2023-06-14 — End: 2023-06-16

## 2023-06-14 MED ORDER — AZITHROMYCIN 500 MG IV SOLR
500.0000 mg | INTRAVENOUS | Status: DC
  Filled 2023-06-14: qty 5

## 2023-06-14 MED ORDER — ENOXAPARIN SODIUM 60 MG/0.6ML IJ SOSY
55.0000 mg | PREFILLED_SYRINGE | INTRAMUSCULAR | Status: DC
  Administered 2023-06-14 – 2023-06-16 (×3): 55 mg via SUBCUTANEOUS
  Filled 2023-06-14 (×3): qty 0.6

## 2023-06-14 MED ORDER — SODIUM CHLORIDE 0.9 % IV SOLN
500.0000 mg | INTRAVENOUS | Status: DC
  Administered 2023-06-15 – 2023-06-16 (×2): 500 mg via INTRAVENOUS
  Filled 2023-06-14 (×2): qty 5

## 2023-06-14 MED ORDER — BUPROPION HCL ER (XL) 150 MG PO TB24
150.0000 mg | ORAL_TABLET | Freq: Every day | ORAL | Status: DC
  Administered 2023-06-14 – 2023-06-16 (×3): 150 mg via ORAL
  Filled 2023-06-14 (×3): qty 1

## 2023-06-14 MED ORDER — LACTATED RINGERS IV BOLUS (SEPSIS)
1000.0000 mL | Freq: Once | INTRAVENOUS | Status: AC
  Administered 2023-06-14: 1000 mL via INTRAVENOUS

## 2023-06-14 MED ORDER — ACETAMINOPHEN 650 MG RE SUPP: 650.0000 mg | Freq: Four times a day (QID) | RECTAL | Status: DC | PRN

## 2023-06-14 MED ORDER — IOHEXOL 350 MG/ML SOLN
100.0000 mL | Freq: Once | INTRAVENOUS | Status: AC | PRN
  Administered 2023-06-14: 100 mL via INTRAVENOUS

## 2023-06-14 MED ORDER — VENLAFAXINE HCL ER 75 MG PO CP24
75.0000 mg | ORAL_CAPSULE | Freq: Every day | ORAL | Status: DC
  Administered 2023-06-14 – 2023-06-16 (×3): 75 mg via ORAL
  Filled 2023-06-14 (×3): qty 1

## 2023-06-14 MED ORDER — CHLORHEXIDINE GLUCONATE CLOTH 2 % EX PADS
6.0000 | MEDICATED_PAD | Freq: Every day | CUTANEOUS | Status: DC
  Administered 2023-06-15: 6 via TOPICAL

## 2023-06-14 MED ORDER — ACETAMINOPHEN 325 MG PO TABS: 650.0000 mg | ORAL_TABLET | Freq: Four times a day (QID) | ORAL | Status: DC | PRN

## 2023-06-14 MED ORDER — LACTATED RINGERS IV BOLUS (SEPSIS)
500.0000 mL | Freq: Once | INTRAVENOUS | Status: AC
  Administered 2023-06-14: 500 mL via INTRAVENOUS

## 2023-06-14 MED ORDER — IPRATROPIUM-ALBUTEROL 0.5-2.5 (3) MG/3ML IN SOLN
3.0000 mL | RESPIRATORY_TRACT | Status: DC
  Administered 2023-06-14 – 2023-06-15 (×6): 3 mL via RESPIRATORY_TRACT
  Filled 2023-06-14 (×6): qty 3

## 2023-06-14 MED ORDER — IPRATROPIUM-ALBUTEROL 0.5-2.5 (3) MG/3ML IN SOLN
3.0000 mL | Freq: Four times a day (QID) | RESPIRATORY_TRACT | Status: DC
Start: 2023-06-14 — End: 2023-06-14
  Administered 2023-06-14 (×2): 3 mL via RESPIRATORY_TRACT
  Filled 2023-06-14 (×2): qty 3

## 2023-06-14 MED ORDER — ALPRAZOLAM 0.25 MG PO TABS
0.2500 mg | ORAL_TABLET | Freq: Once | ORAL | Status: AC
  Administered 2023-06-14: 0.25 mg via ORAL
  Filled 2023-06-14: qty 1

## 2023-06-14 MED ORDER — LACTATED RINGERS IV BOLUS
1000.0000 mL | Freq: Once | INTRAVENOUS | Status: AC
  Administered 2023-06-14: 1000 mL via INTRAVENOUS

## 2023-06-14 MED ORDER — METHYLPREDNISOLONE SODIUM SUCC 40 MG IJ SOLR
40.0000 mg | Freq: Two times a day (BID) | INTRAMUSCULAR | Status: AC
  Administered 2023-06-14 (×2): 40 mg via INTRAVENOUS
  Filled 2023-06-14 (×2): qty 1

## 2023-06-14 MED ORDER — ASPIRIN 81 MG PO TBEC
81.0000 mg | DELAYED_RELEASE_TABLET | Freq: Every day | ORAL | Status: DC
  Administered 2023-06-14 – 2023-06-16 (×3): 81 mg via ORAL
  Filled 2023-06-14 (×3): qty 1

## 2023-06-14 MED ORDER — SODIUM CHLORIDE 0.9 % IV SOLN: 2.0000 g | INTRAVENOUS | Status: DC

## 2023-06-14 MED ORDER — ALBUTEROL SULFATE (2.5 MG/3ML) 0.083% IN NEBU: 2.5000 mg | INHALATION_SOLUTION | RESPIRATORY_TRACT | Status: DC | PRN

## 2023-06-14 MED ORDER — CHLORHEXIDINE GLUCONATE CLOTH 2 % EX PADS: 6.0000 | MEDICATED_PAD | Freq: Every day | CUTANEOUS | Status: DC

## 2023-06-14 MED ORDER — DEXTROSE 5 % IV SOLN
500.0000 mg | INTRAVENOUS | Status: DC
  Administered 2023-06-14: 500 mg via INTRAVENOUS
  Filled 2023-06-14 (×3): qty 5

## 2023-06-14 NOTE — Progress Notes (Signed)
Dr. Chipper Herb came to the patient's bedside and ordered for patient to be placed on 8L [place] and remove bi-pap for appropriate amount of time so patient can have dinner.  [place] placed, and patient updated on needing to wait 30 minutes before eating a meal and after eating a meal before resuming bi-pap.

## 2023-06-14 NOTE — Hospital Course (Signed)
Nicole Mason is a 52 y.o. female with medical history significant for COPD, depression, morbid obesity, on CPAP, former smoker who was brought to the hospital with severe respiratory distress.  Chest x-ray showed a right lower lobe pneumonia, she has significant tachycardia, leukocytosis, lactic acidosis of 7.3. Patient received 2.5 L of IV fluid bolus, continued on IV fluids.  Patient was also started on steroids and Rocephin and Zithromax. Due to respiratory distress, patient was placed on BiPAP. 11/3.  Off of BiPAP, on 4 L oxygen, transferred to medical floor. 11/4.  Symptom improved, went down to 1 L oxygen with good saturation.  Obtain home ox evaluation, medically stable for discharge.

## 2023-06-14 NOTE — Progress Notes (Signed)
Attempted to call and get report.

## 2023-06-14 NOTE — Assessment & Plan Note (Signed)
Continue venlafaxine and bupropion

## 2023-06-14 NOTE — ED Notes (Signed)
RT rounded notifying nurse of patients change of neb treatments to Q4hours. And that the patient has wheezing.

## 2023-06-14 NOTE — Assessment & Plan Note (Addendum)
COPD exacerbation Acute respiratory failure with hypoxia Patient meeting sepsis criteria, although could be explained by increased work of breathing Continue BiPAP and wean as tolerated Scheduled and as needed DuoNebs Rocephin and azithromycin and sepsis fluids Will continue low-dose methylprednisolone given strong element of COPD in spite of lower lobe pneumonia seen Antitussives Incentive spirometer and flutter valve

## 2023-06-14 NOTE — ED Notes (Signed)
Pt is sleeping

## 2023-06-14 NOTE — ED Notes (Signed)
Spoke with RT. Pt still needs to be on bipap. Per RT, could give pt small amount of food then put back on bipap after 30 min. This RN usually waits one hour. Will trial PO intake and see.

## 2023-06-14 NOTE — Plan of Care (Signed)
Continuing with plan of care. 

## 2023-06-14 NOTE — Progress Notes (Signed)
Report received from ED RN.

## 2023-06-14 NOTE — H&P (Signed)
History and Physical    Patient: Nicole Mason:[id number] DOB: March 20, 1971 DOA: 06/13/2023 DOS: the patient was seen and examined on 06/14/2023 PCP: System, Provider Not In  Patient coming from: Home  Chief Complaint:  Chief Complaint  Patient presents with   Shortness of Breath    HPI: Nicole Mason is a 51 y.o. female with medical history significant for COPD, depression with SI on CPAP, former smoker, hospitalized in October 2023 with respiratory failure from COPD requiring BiPAP, last followed up with a pulmonologist just a 3 weeks prior on 10/9 when she complained of dyspnea on exertion and cough, placed on a prednisone taper, who presents to the emergency room with wheezing that worsened in the past 4 days.  With EMS O2 sat was 86% for which she was placed on 3 L.  She received DuoNebs and Solu-Medrol en route.  She denied chest pain or leg swelling. ED course and data review: Upon arrival O2 sat 89% and patient was transitioned to BiPAP.  She had increased work of breathing tachycardic to the 130s tachypneic to 24.  Normal blood pressure and afebrile. Labs: VBG with pH 7.42 and pCO2 38 WBC 16,000 and lactic acid 7.3 Respiratory viral panel negative for COVID flu and RSV CMP mostly unremarkable.  Lipase and LFTs normal, urinalysis unremarkable EKG Personally viewed and interpreted showing sinus tachycardia at 105 with no ischemic changes CTA PE protocol shows right lower lobe pneumonia without evidence of PE Patient started on ceftriaxone and azithromycin and sepsis fluids Hospitalist consulted for admission.   Review of Systems: As mentioned in the history of present illness. All other systems reviewed and are negative.  Past Medical History:  Diagnosis Date   Asthma    COPD (chronic obstructive pulmonary disease) (HCC)    Past Surgical History:  Procedure Laterality Date   TUBAL LIGATION     Social History:  reports that she quit smoking about 2 years ago. Her  smoking use included cigarettes. She started smoking about 27 years ago. She has a 12.5 pack-year smoking history. She has quit using smokeless tobacco. She reports current alcohol use. No history on file for drug use.  Allergies  Allergen Reactions   Oxycodone-Acetaminophen     Other reaction(s): Other (See Comments)    Family History  Problem Relation Age of Onset   Breast cancer Mother 59    Prior to Admission medications   Medication Sig Start Date End Date Taking? Authorizing Provider  acetaminophen (TYLENOL) 650 MG CR tablet Take 650 mg by mouth every 8 (eight) hours as needed.    [provider]  albuterol (VENTOLIN HFA) 108 (90 Base) MCG/ACT inhaler Inhale 2 puffs into the lungs every 4 (four) hours as needed for shortness of breath or wheezing.    [provider]  Ernestina Patches 62.5-25 MCG/ACT AEPB Inhale 1 puff into the lungs daily. 05/16/22   [provider]  aspirin EC 81 MG tablet Take 81 mg by mouth daily.    [provider]  buPROPion (WELLBUTRIN XL) 150 MG 24 hr tablet Take 150 mg by mouth daily. 03/22/22   [provider]  venlafaxine XR (EFFEXOR-XR) 75 MG 24 hr capsule Take 75 mg by mouth daily. 05/16/22   [provider]    Physical Exam: Vitals:   06/14/23 0240 06/14/23 0240 06/14/23 0315 06/14/23 0400  BP: (!) 155/88   (!) 148/83  Pulse: (!) 123  (!) 132 (!) 126  Resp: (!) 22  Marland Kitchen)  24 (!) 22  Temp:  98.1 F (36.7 C)    TempSrc:  Oral    SpO2: 92%  97% 97%   Physical Exam Vitals and nursing note reviewed.  Constitutional:      General: She is not in acute distress.    Appearance: She is obese.     Comments: Patient sitting upright on bed speaking in short sentences, increased work of breathing  HENT:     Head: Normocephalic and atraumatic.  Cardiovascular:     Rate and Rhythm: Regular rhythm. Tachycardia present.     Heart sounds: Normal heart sounds.  Pulmonary:     Effort: Tachypnea and accessory  muscle usage present.     Breath sounds: Wheezing and rhonchi present.  Abdominal:     Palpations: Abdomen is soft.     Tenderness: There is no abdominal tenderness.  Musculoskeletal:     Right lower leg: No edema.     Left lower leg: No edema.  Neurological:     Mental Status: Mental status is at baseline.     Labs on Admission: I have personally reviewed following labs and imaging studies  CBC: Recent Labs  Lab 06/13/23 2354  WBC 16.7*  NEUTROABS 16.0*  HGB 14.4  HCT 43.6  MCV 91.2  PLT 258   Basic Metabolic Panel: Recent Labs  Lab 06/13/23 2354  NA 133*  K 3.9  CL 100  CO2 22  GLUCOSE 164*  BUN 7  CREATININE 0.73  CALCIUM 9.4  MG 1.4*   GFR: CrCl cannot be calculated (Unknown ideal weight.). Liver Function Tests: Recent Labs  Lab 06/13/23 2354  AST 32  ALT 30  ALKPHOS 138*  BILITOT 0.9  PROT 8.2*  ALBUMIN 4.6   Recent Labs  Lab 06/13/23 2354  LIPASE 25   No results for input(s): "AMMONIA" in the last 168 hours. Coagulation Profile: Recent Labs  Lab 06/13/23 2354  INR 1.0   Cardiac Enzymes: No results for input(s): "CKTOTAL", "CKMB", "CKMBINDEX", "TROPONINI" in the last 168 hours. BNP (last 3 results) No results for input(s): "PROBNP" in the last 8760 hours. HbA1C: No results for input(s): "HGBA1C" in the last 72 hours. CBG: No results for input(s): "GLUCAP" in the last 168 hours. Lipid Profile: No results for input(s): "CHOL", "HDL", "LDLCALC", "TRIG", "CHOLHDL", "LDLDIRECT" in the last 72 hours. Thyroid Function Tests: No results for input(s): "TSH", "T4TOTAL", "FREET4", "T3FREE", "THYROIDAB" in the last 72 hours. Anemia Panel: No results for input(s): "VITAMINB12", "FOLATE", "FERRITIN", "TIBC", "IRON", "RETICCTPCT" in the last 72 hours. Urine analysis:    Component Value Date/Time   COLORURINE COLORLESS (A) 06/14/2023 0314   APPEARANCEUR CLEAR (A) 06/14/2023 0314   LABSPEC 1.015 06/14/2023 0314   PHURINE 5.0 06/14/2023 0314    GLUCOSEU >=500 (A) 06/14/2023 0314   HGBUR NEGATIVE 06/14/2023 0314   BILIRUBINUR NEGATIVE 06/14/2023 0314   KETONESUR NEGATIVE 06/14/2023 0314   PROTEINUR NEGATIVE 06/14/2023 0314   NITRITE NEGATIVE 06/14/2023 0314   LEUKOCYTESUR NEGATIVE 06/14/2023 0314    Radiological Exams on Admission: CT Angio Chest PE W/Cm &/Or Wo Cm  Result Date: 06/14/2023 CLINICAL DATA:  Hypoxia, cough, evaluate for PE EXAM: CT ANGIOGRAPHY CHEST WITH CONTRAST TECHNIQUE: Multidetector CT imaging of the chest was performed using the standard protocol during bolus administration of intravenous contrast. Multiplanar CT image reconstructions and MIPs were obtained to evaluate the vascular anatomy. RADIATION DOSE REDUCTION: This exam was performed according to the departmental dose-optimization program which includes automated exposure control, adjustment of the  mA and/or kV according to patient size and/or use of iterative reconstruction technique. CONTRAST:  OMNIPAQUE IOHEXOL 350 MG/ML SOLN COMPARISON:  Chest radiograph dated 06/13/2023. CT chest dated 05/23/2022. FINDINGS: Cardiovascular: Suboptimal evaluation of the pulmonary arteries due to bolus timing, with preferential opacification of the aorta. However, with this constraint, there is no evidence of pulmonary embolism to the lobar level. No evidence of thoracic aortic aneurysm or dissection. Mild atherosclerotic calcifications of the aortic arch. The heart is normal in size.  No pericardial effusion. Mediastinum/Nodes: No suspicious mediastinal lymphadenopathy. Visualized thyroid is unremarkable. Lungs/Pleura: Mild patchy opacity in the medial right lower lobe (series 6/image 68), suspicious for pneumonia. Mild mosaic attenuation in the lungs bilaterally. Mild centrilobular emphysematous changes, upper lung predominant. No suspicious pulmonary nodules, noting motion degradation. No pleural effusion or pneumothorax. Upper Abdomen: Visualized upper abdomen is notable for  a small hiatal hernia. Musculoskeletal: Degenerative changes of the visualized thoracolumbar spine. Review of the MIP images confirms the above findings. IMPRESSION: No evidence of pulmonary embolism. Right lower lobe pneumonia. Aortic Atherosclerosis (ICD10-I70.0) and Emphysema (ICD10-J43.9). Electronically Signed   By: Charline Bills M.D.   On: 06/14/2023 02:26   DG Chest Port 1 View  Result Date: 06/13/2023 CLINICAL DATA:  Sepsis EXAM: PORTABLE CHEST 1 VIEW COMPARISON:  None Available. FINDINGS: The heart size and mediastinal contours are within normal limits. Both lungs are clear. The visualized skeletal structures are unremarkable. IMPRESSION: No active disease. Electronically Signed   By: Helyn Numbers M.D.   On: 06/13/2023 23:55     Data Reviewed: Relevant notes from primary care and specialist visits, past discharge summaries as available in EHR, including Care Everywhere. Prior diagnostic testing as pertinent to current admission diagnoses Updated medications and problem lists for reconciliation ED course, including vitals, labs, imaging, treatment and response to treatment Triage notes, nursing and pharmacy notes and ED provider's notes Notable results as noted in HPI   Assessment and Plan: * Sepsis due to pneumonia [facility]) COPD exacerbation Acute respiratory failure with hypoxia Patient meeting sepsis criteria, although could be explained by increased work of breathing Continue BiPAP and wean as tolerated Scheduled and as needed DuoNebs Rocephin and azithromycin and sepsis fluids Will continue low-dose methylprednisolone given strong element of COPD in spite of lower lobe pneumonia seen Antitussives Incentive spirometer and flutter valve  OSA on CPAP Nightly CPAP  Depression Continue venlafaxine and bupropion     DVT prophylaxis: Lovenox  Consults: none  Advance Care Planning:   Code Status: Prior   Family Communication: none  Disposition Plan: Back to  previous home environment  Severity of Illness: The appropriate patient status for this patient is INPATIENT. Inpatient status is judged to be reasonable and necessary in order to provide the required intensity of service to ensure the patient's safety. The patient's presenting symptoms, physical exam findings, and initial radiographic and laboratory data in the context of their chronic comorbidities is felt to place them at high risk for further clinical deterioration. Furthermore, it is not anticipated that the patient will be medically stable for discharge from the hospital within 2 midnights of admission.   * I certify that at the point of admission it is my clinical judgment that the patient will require inpatient hospital care spanning beyond 2 midnights from the point of admission due to high intensity of service, high risk for further deterioration and high frequency of surveillance required.*  Author: Andris Baumann, MD 06/14/2023 4:27 AM  For on call review [email or web address].

## 2023-06-14 NOTE — ED Notes (Signed)
Hospitalist saw pt at bedside and told primary RN's that pt will not be downgraded to medsurg.

## 2023-06-14 NOTE — ED Notes (Signed)
Pt asking to eat. Is still on bipap. Contacted RT re: whether she thinks pt would be able to come off bipap long enough to eat then wait an hour before going back on.

## 2023-06-14 NOTE — Progress Notes (Signed)
PHARMACIST - PHYSICIAN COMMUNICATION  CONCERNING:  Enoxaparin (Lovenox) for DVT Prophylaxis    RECOMMENDATION: Patient was prescribed enoxaprin 40mg  q24 hours for VTE prophylaxis.   Filed Weights   06/14/23 0400  Weight: 106.6 kg (235 lb 0.2 oz)    Body mass index is 40.34 kg/m.  Estimated Creatinine Clearance: 98 mL/min (by C-G formula based on SCr of 0.73 mg/dL).   Based on [facility] policy patient is candidate for enoxaparin 0.5mg /kg TBW SQ every 24 hours based on BMI being >30.  DESCRIPTION: Pharmacy has adjusted enoxaparin dose per [facility] policy.  Patient is now receiving enoxaparin 0.5 mg/kg every 24 hours   Otelia Sergeant, PharmD, [facility] 06/14/2023 4:44 AM

## 2023-06-14 NOTE — ED Notes (Signed)
Called RT. Stated they will be over in just a few minutes.

## 2023-06-14 NOTE — Sepsis Progress Note (Addendum)
Elink monitoring for the code sepsis protocol.   1610: Notified bedside nurse of need to draw 2nd lactic acid.   0421: Notified provider MD York Cerise of need to order 3rd lactic acid.

## 2023-06-14 NOTE — Assessment & Plan Note (Signed)
Nightly CPAP 

## 2023-06-14 NOTE — Progress Notes (Signed)
Order received for patient to have bipap prn and to utilize purwik.

## 2023-06-14 NOTE — ED Notes (Signed)
Had messaged hospitalist after pt had panic attack, requesting PRN anxiolytic. Have not heard back yet. Also messaged re: PO meds to be given as pt is on bipap and usually bipap pts are NPO.

## 2023-06-14 NOTE — ED Notes (Addendum)
Patient having panic attack after administration of steroid. Fanning pt, cool packs, and cold rags applied in addition to talking patient through panic attack to "breath" helped resolve attack.

## 2023-06-14 NOTE — Progress Notes (Signed)
Progress Note   Patient: Nicole Mason RUE:[id number] DOB: Aug 09, 1971 DOA: 06/13/2023     0 DOS: the patient was seen and examined on 06/14/2023   Brief hospital course: Nicole Mason is a 52 y.o. female with medical history significant for COPD, depression, morbid obesity, on CPAP, former smoker who was brought to the hospital with severe respiratory distress.  Chest x-ray showed a right lower lobe pneumonia, she has significant tachycardia, leukocytosis, lactic acidosis of 7.3. Patient received 2.5 L of IV fluid bolus, continued on IV fluids.  Patient was also started on steroids and Rocephin and Zithromax. Due to respiratory distress, patient was placed on BiPAP.    Principal Problem:   Sepsis due to pneumonia [facility]) Active Problems:   Acute respiratory failure with hypoxia (HCC)   Acute exacerbation of COPD with asthma (HCC)   Depression   OSA on CPAP   Septic shock (HCC)   Assessment and Plan: Septic shock. * Sepsis due to pneumonia [facility]) Patient had a significant tachycardia, leukocytosis.  Complicated by acute respiratory failure, lactic acidosis of 7.3.  Patient met criteria for septic shock.  Patient has received IV fluid bolus, currently she is hemodynamically stable.  Lactic acid still elevated, recheck levels again. Continue IV fluids. Culture from blood is pending, continue Rocephin and Zithromax.  COPD exacerbation Acute respiratory failure with hypoxia OSA on CPAP Patient still has significant respite distress, not able to wean off BiPAP.  BNP 38.7, no evidence of volume overload. Will continue IV steroids, continue BiPAP. Patient will be monitored closely in the ICU stepdown unit.  Hyponatremia Hypomagnesemia. Conditions improved.  Morbid obesity.  BMI 40.34. Diet and exercise advised.  Depression Continue venlafaxine and bupropion       Subjective:  Patient still has resp distress, not able to wean off BiPAP.  Significant tachycardia, she has a  cough, nonproductive.  No fever or chills.  Physical Exam: Vitals:   06/14/23 0900 06/14/23 1000 06/14/23 1038 06/14/23 1130  BP: (!) 147/83 (!) 144/82  130/83  Pulse: (!) 126 (!) 123  (!) 119  Resp: (!) 22 19  (!) 21  Temp:   99.7 F (37.6 C)   TempSrc:   Oral   SpO2: 96% 98%  99%  Weight:      Height:       General exam: Appears calm and comfortable, morbid obesity. Respiratory system: Decreased breathing sounds. Respiratory effort normal. Cardiovascular system: S1 & S2 heard, RRR. No JVD, murmurs, rubs, gallops or clicks. No pedal edema. Gastrointestinal system: Abdomen is nondistended, soft and nontender. No organomegaly or masses felt. Normal bowel sounds heard. Central nervous system: Alert and oriented. No focal neurological deficits. Extremities: Symmetric 5 x 5 power. Skin: No rashes, lesions or ulcers Psychiatry: Judgement and insight appear normal. Mood & affect appropriate.    Data Reviewed:  Reviewed chest x-ray and lab results.  Family Communication: husband updated   Disposition: Status is: Inpatient Remains inpatient appropriate because: icu care     CRITICAL CARE Performed by: Marrion Coy   Total critical care time: 55 minutes  Critical care time was exclusive of separately billable procedures and treating other patients.  Critical care was necessary to treat or prevent imminent or life-threatening deterioration.  Critical care was time spent personally by me on the following activities: development of treatment plan with patient and/or surrogate as well as nursing, discussions with consultants, evaluation of patient's response to treatment, examination of patient, obtaining history from patient or surrogate, ordering  and performing treatments and interventions, ordering and review of laboratory studies, ordering and review of radiographic studies, pulse oximetry and re-evaluation of patient's condition.   Author: Marrion Coy, MD 06/14/2023 12:17  PM  For on call review [email or web address].

## 2023-06-14 NOTE — ED Notes (Signed)
Explained situation to pt. She will stay on the bipap.

## 2023-06-14 NOTE — Progress Notes (Signed)
CODE SEPSIS - PHARMACY COMMUNICATION  **Broad Spectrum Antibiotics should be administered within 1 hour of Sepsis diagnosis**  Time Code Sepsis Called/Page Received: 0133  Antibiotics Ordered: Azithromycin & Ceftriaxone  Time of 1st antibiotic administration: 0207  Otelia Sergeant, PharmD, MBA 06/14/2023 2:10 AM

## 2023-06-15 DIAGNOSIS — J189 Pneumonia, unspecified organism: Secondary | ICD-10-CM

## 2023-06-15 DIAGNOSIS — J9601 Acute respiratory failure with hypoxia: Secondary | ICD-10-CM | POA: Diagnosis not present

## 2023-06-15 DIAGNOSIS — J441 Chronic obstructive pulmonary disease with (acute) exacerbation: Secondary | ICD-10-CM | POA: Diagnosis not present

## 2023-06-15 DIAGNOSIS — A419 Sepsis, unspecified organism: Secondary | ICD-10-CM

## 2023-06-15 DIAGNOSIS — R6521 Severe sepsis with septic shock: Secondary | ICD-10-CM

## 2023-06-15 LAB — BASIC METABOLIC PANEL
Anion gap: 8 (ref 5–15)
BUN: 11 mg/dL (ref 6–20)
CO2: 25 mmol/L (ref 22–32)
Calcium: 9.2 mg/dL (ref 8.9–10.3)
Chloride: 105 mmol/L (ref 98–111)
Creatinine, Ser: 0.67 mg/dL (ref 0.44–1.00)
GFR, Estimated: >60 mL/min (ref >60)
Glucose, Bld: 150 mg/dL — ABNORMAL HIGH (ref 70–99)
Potassium: 3.9 mmol/L (ref 3.5–5.1)
Sodium: 138 mmol/L (ref 135–145)

## 2023-06-15 LAB — CBC
HCT: 38.4 % (ref 36.0–46.0)
Hemoglobin: 13 g/dL (ref 12.0–15.0)
MCH: 31 pg (ref 26.0–34.0)
MCHC: 33.9 g/dL (ref 30.0–36.0)
MCV: 91.6 fL (ref 80.0–100.0)
Platelets: 224 10*3/uL (ref 150–400)
RBC: 4.19 MIL/uL (ref 3.87–5.11)
RDW: 13.6 % (ref 11.5–15.5)
WBC: 25.8 10*3/uL — ABNORMAL HIGH (ref 4.0–10.5)
nRBC: 0 % (ref 0.0–0.2)

## 2023-06-15 LAB — MAGNESIUM: Magnesium: 2.4 mg/dL (ref 1.7–2.4)

## 2023-06-15 MED ORDER — CALCIUM CARBONATE ANTACID 500 MG PO CHEW
1.0000 | CHEWABLE_TABLET | Freq: Once | ORAL | Status: AC
  Administered 2023-06-15: 200 mg via ORAL
  Filled 2023-06-15: qty 1

## 2023-06-15 MED ORDER — IPRATROPIUM-ALBUTEROL 0.5-2.5 (3) MG/3ML IN SOLN
3.0000 mL | Freq: Four times a day (QID) | RESPIRATORY_TRACT | Status: DC
  Administered 2023-06-15 – 2023-06-16 (×3): 3 mL via RESPIRATORY_TRACT
  Filled 2023-06-15 (×3): qty 3

## 2023-06-15 NOTE — Plan of Care (Signed)
Continuing with plan of care. 

## 2023-06-15 NOTE — Progress Notes (Signed)
Order received to discontinue patient's bi-pap.

## 2023-06-15 NOTE — Progress Notes (Signed)
  Progress Note   Patient: Nicole Mason YQM:[id number] DOB: 1970/08/18 DOA: 06/13/2023     1 DOS: the patient was seen and examined on 06/15/2023   Brief hospital course: Nicole Mason is a 52 y.o. female with medical history significant for COPD, depression, morbid obesity, on CPAP, former smoker who was brought to the hospital with severe respiratory distress.  Chest x-ray showed a right lower lobe pneumonia, she has significant tachycardia, leukocytosis, lactic acidosis of 7.3. Patient received 2.5 L of IV fluid bolus, continued on IV fluids.  Patient was also started on steroids and Rocephin and Zithromax. Due to respiratory distress, patient was placed on BiPAP. 11/3.  Off of BiPAP, on 4 L oxygen, transferred to medical floor.    Principal Problem:   Sepsis due to pneumonia [facility]) Active Problems:   Acute respiratory failure with hypoxia (HCC)   Acute exacerbation of COPD with asthma (HCC)   Depression   OSA on CPAP   Septic shock (HCC)   Assessment and Plan: Septic shock. * Sepsis due to pneumonia [facility]) Patient had a significant tachycardia, leukocytosis.  Complicated by acute respiratory failure, lactic acidosis of 7.3.  Patient met criteria for septic shock.  Patient has received IV fluid bolus, currently she is hemodynamically stable.   Condition has improved, lactic acid dropped down.  Sinus tachycardia also improving.  IV fluids has been discontinued.  Will transfer patient to general medical floor.  Dissipating discharge tomorrow.  Continue Rocephin and Zithromax.  Blood culture negative.   COPD exacerbation Acute respiratory failure with hypoxia OSA  Patient had significant respite distress, not able to wean off BiPAP.  BNP 38.7, no evidence of volume overload. Was able to wean down to 8 L oxygen last night, further dropped to 4 L today.  Short of breath improved, she did not need BiPAP last night.  Per patient husband, patient has been deemed to no need for CPAP at  home after sleep study.  Continuing oxygen.    Hyponatremia Hypomagnesemia. Conditions improved.   Morbid obesity.  BMI 40.34. Diet and exercise advised.   Depression Continue venlafaxine and bupropion      Subjective:  Patient condition much improved today, short of breath much better.  No wheezing.  Physical Exam: Vitals:   06/15/23 0900 06/15/23 1000 06/15/23 1100 06/15/23 1200  BP: 119/81 (!) 141/82 (!) 140/78 139/83  Pulse: (!) 107 (!) 107 (!) 119 100  Resp: 15 17 15 18   Temp:    99.1 F (37.3 C)  TempSrc:    Oral  SpO2: 96% 90% 95% 94%  Weight:      Height:       General exam: Appears calm and comfortable, morbid obesity. Respiratory system: Decreased breathing sounds. Respiratory effort normal. Cardiovascular system: S1 & S2 heard, RRR. No JVD, murmurs, rubs, gallops or clicks. No pedal edema. Gastrointestinal system: Abdomen is nondistended, soft and nontender. No organomegaly or masses felt. Normal bowel sounds heard. Central nervous system: Alert and oriented. No focal neurological deficits. Extremities: Symmetric 5 x 5 power. Skin: No rashes, lesions or ulcers Psychiatry: Judgement and insight appear normal. Mood & affect appropriate.    Data Reviewed:  Lab results reviewed.  Family Communication: Husband updated at bedside.  Disposition: Status is: Inpatient Remains inpatient appropriate because: Severity of disease.  IV antibiotics.     Time spent: 35 minutes  Author: Marrion Coy, MD 06/15/2023 12:48 PM  For on call review [email or web address].

## 2023-06-16 DIAGNOSIS — A419 Sepsis, unspecified organism: Secondary | ICD-10-CM | POA: Diagnosis not present

## 2023-06-16 DIAGNOSIS — J9601 Acute respiratory failure with hypoxia: Secondary | ICD-10-CM | POA: Diagnosis not present

## 2023-06-16 DIAGNOSIS — J189 Pneumonia, unspecified organism: Secondary | ICD-10-CM | POA: Diagnosis not present

## 2023-06-16 DIAGNOSIS — J441 Chronic obstructive pulmonary disease with (acute) exacerbation: Secondary | ICD-10-CM | POA: Diagnosis not present

## 2023-06-16 MED ORDER — IPRATROPIUM-ALBUTEROL 0.5-2.5 (3) MG/3ML IN SOLN: 3.0000 mL | RESPIRATORY_TRACT | 0 refills | Status: DC | PRN

## 2023-06-16 MED ORDER — CEFDINIR 300 MG PO CAPS: 300.0000 mg | ORAL_CAPSULE | Freq: Two times a day (BID) | ORAL | 0 refills | Status: AC

## 2023-06-16 MED ORDER — CEFDINIR 300 MG PO CAPS: 300.0000 mg | ORAL_CAPSULE | Freq: Two times a day (BID) | ORAL | 0 refills | Status: DC

## 2023-06-16 MED ORDER — PREDNISONE 20 MG PO TABS: ORAL_TABLET | ORAL | 0 refills | Status: AC

## 2023-06-16 MED ORDER — PREDNISONE 20 MG PO TABS: ORAL_TABLET | ORAL | 0 refills | Status: DC

## 2023-06-16 MED ORDER — AZITHROMYCIN 500 MG PO TABS: 500.0000 mg | ORAL_TABLET | Freq: Every day | ORAL | 0 refills | Status: DC

## 2023-06-16 MED ORDER — AZITHROMYCIN 500 MG PO TABS
500.0000 mg | ORAL_TABLET | Freq: Every day | ORAL | 0 refills | Status: AC
Start: 2023-06-16 — End: 2023-06-18

## 2023-06-16 NOTE — Discharge Instructions (Signed)
Call your PCP and schedule follow-up within 1 week. Follow-up with pulmonology as a previous scheduled.

## 2023-06-16 NOTE — Discharge Summary (Addendum)
Physician Discharge Summary   Patient: Nicole Mason MRN: [id number] DOB: 23-Jan-1971  Admit date:     06/13/2023  Discharge date: 06/16/23  Discharge Physician: Marrion Coy   PCP: System, Provider Not In   Recommendations at discharge:   Follow-up with PCP in 1 week. Follow-up with pulmonology as previous scheduled. Obtain overnight oximetry or sleep study as outpatient.  Discharge Diagnoses: Principal Problem:   Sepsis due to pneumonia [facility]) Active Problems:   Acute respiratory failure with hypoxia (HCC)   Acute exacerbation of COPD with asthma (HCC)   Depression   OSA on CPAP   Septic shock (HCC)  Resolved Problems:   * No resolved hospital problems. *   Hospital Course: Nicole Mason is a 52 y.o. female with medical history significant for COPD, depression, morbid obesity, not on CPAP, former smoker who was brought to the hospital with severe respiratory distress.  Chest x-ray showed a right lower lobe pneumonia, she has significant tachycardia, leukocytosis, lactic acidosis of 7.3. Patient received 2.5 L of IV fluid bolus, continued on IV fluids.  Patient was also started on steroids and Rocephin and Zithromax. Due to respiratory distress, patient was placed on BiPAP. 11/3.  Off of BiPAP, on 4 L oxygen, transferred to medical floor. 11/4.  Symptom improved, went down to 1 L oxygen with good saturation.  Obtain home ox evaluation, medically stable for discharge.  Assessment and Plan: Septic shock. * Sepsis due to pneumonia (HCC) Right lower lobe pneumonia. Patient had a significant tachycardia, leukocytosis.  Complicated by acute respiratory failure, lactic acidosis of 7.3.  Patient met criteria for septic shock.  Patient has received IV fluid bolus continued IV fluids.  Lactic acidosis improved afterwards.   Patient was initially managed in the ICU stepdown unit for septic shock.  Transferred out since yesterday.  She is treated with Zithromax and Rocephin.  Blood  culture came back negative.    COPD exacerbation Acute respiratory failure with hypoxia OSA  Patient had significant respite distress, intially not able to wean off BiPAP, she was admitted to ICU stepdown unit.  BNP 38.7, no evidence of volume overload. Condition improved since yesterday, she was off of BiPAP in the evening of 11/2.  Oxygenation gradually improving.  Will obtain oxygen evaluation before discharge. She was treated with IV steroids followed with oral prednisone.  Will continue steroid taper.  Patient also has a nebulizer machine, will prescribe DuoNeb as needed.      Hyponatremia Hypomagnesemia. Conditions improved.   Morbid obesity.  BMI 40.34. Diet and exercise advised.   Depression Continue venlafaxine and bupropion        Consultants: None Procedures performed: None  Disposition: Home Diet recommendation:  Discharge Diet Orders (From admission, onward)     Start     Ordered   06/16/23 0000  Diet - low sodium heart healthy        06/16/23 1015           Cardiac diet DISCHARGE MEDICATION: Allergies as of 06/16/2023       Reactions   Oxycodone-acetaminophen    Other reaction(s): Other (See Comments)        Medication List     TAKE these medications    acetaminophen 650 MG CR tablet Commonly known as: TYLENOL Take 650 mg by mouth every 8 (eight) hours as needed.   albuterol 108 (90 Base) MCG/ACT inhaler Commonly known as: VENTOLIN HFA Inhale 2 puffs into the lungs every 4 (four) hours as needed  for shortness of breath or wheezing.   Anoro Ellipta 62.5-25 MCG/ACT Aepb Generic drug: umeclidinium-vilanterol Inhale 1 puff into the lungs daily.   aspirin EC 81 MG tablet Take 81 mg by mouth daily.   azithromycin 500 MG tablet Commonly known as: Zithromax Take 1 tablet (500 mg total) by mouth daily for 2 days.   buPROPion 150 MG 24 hr tablet Commonly known as: WELLBUTRIN XL Take 150 mg by mouth daily.   cefdinir 300 MG  capsule Commonly known as: OMNICEF Take 1 capsule (300 mg total) by mouth 2 (two) times daily for 4 days.   ipratropium-albuterol 0.5-2.5 (3) MG/3ML Soln Commonly known as: DUONEB Take 3 mLs by nebulization every 4 (four) hours as needed.   predniSONE 20 MG tablet Commonly known as: DELTASONE Take 2 tablets (40 mg total) by mouth daily with breakfast for 2 days, THEN 1 tablet (20 mg total) daily with breakfast for 3 days, THEN 0.5 tablets (10 mg total) daily with breakfast for 3 days. Start taking on: June 17, 2023   Spiriva HandiHaler 18 MCG inhalation capsule Generic drug: tiotropium Place 18 mcg into inhaler and inhale daily.   venlafaxine XR 75 MG 24 hr capsule Commonly known as: EFFEXOR-XR Take 75 mg by mouth daily.        Discharge Exam: Filed Weights   06/14/23 0400 06/14/23 1425  Weight: 106.6 kg 109.2 kg   General exam: Appears calm and comfortable, morbid obese. Respiratory system: Decreased breath sounds. Respiratory effort normal. Cardiovascular system: S1 & S2 heard, RRR. No JVD, murmurs, rubs, gallops or clicks. No pedal edema. Gastrointestinal system: Abdomen is nondistended, soft and nontender. No organomegaly or masses felt. Normal bowel sounds heard. Central nervous system: Alert and oriented. No focal neurological deficits. Extremities: Symmetric 5 x 5 power. Skin: No rashes, lesions or ulcers Psychiatry: Judgement and insight appear normal. Mood & affect appropriate.    Condition at discharge: good  The results of significant diagnostics from this hospitalization (including imaging, microbiology, ancillary and laboratory) are listed below for reference.   Imaging Studies: CT Angio Chest PE W/Cm &/Or Wo Cm  Result Date: 06/14/2023 CLINICAL DATA:  Hypoxia, cough, evaluate for PE EXAM: CT ANGIOGRAPHY CHEST WITH CONTRAST TECHNIQUE: Multidetector CT imaging of the chest was performed using the standard protocol during bolus administration of  intravenous contrast. Multiplanar CT image reconstructions and MIPs were obtained to evaluate the vascular anatomy. RADIATION DOSE REDUCTION: This exam was performed according to the departmental dose-optimization program which includes automated exposure control, adjustment of the mA and/or kV according to patient size and/or use of iterative reconstruction technique. CONTRAST:  OMNIPAQUE IOHEXOL 350 MG/ML SOLN COMPARISON:  Chest radiograph dated 06/13/2023. CT chest dated 05/23/2022. FINDINGS: Cardiovascular: Suboptimal evaluation of the pulmonary arteries due to bolus timing, with preferential opacification of the aorta. However, with this constraint, there is no evidence of pulmonary embolism to the lobar level. No evidence of thoracic aortic aneurysm or dissection. Mild atherosclerotic calcifications of the aortic arch. The heart is normal in size.  No pericardial effusion. Mediastinum/Nodes: No suspicious mediastinal lymphadenopathy. Visualized thyroid is unremarkable. Lungs/Pleura: Mild patchy opacity in the medial right lower lobe (series 6/image 68), suspicious for pneumonia. Mild mosaic attenuation in the lungs bilaterally. Mild centrilobular emphysematous changes, upper lung predominant. No suspicious pulmonary nodules, noting motion degradation. No pleural effusion or pneumothorax. Upper Abdomen: Visualized upper abdomen is notable for a small hiatal hernia. Musculoskeletal: Degenerative changes of the visualized thoracolumbar spine. Review of the MIP images  confirms the above findings. IMPRESSION: No evidence of pulmonary embolism. Right lower lobe pneumonia. Aortic Atherosclerosis (ICD10-I70.0) and Emphysema (ICD10-J43.9). Electronically Signed   By: Charline Bills M.D.   On: 06/14/2023 02:26   DG Chest Port 1 View  Result Date: 06/13/2023 CLINICAL DATA:  Sepsis EXAM: PORTABLE CHEST 1 VIEW COMPARISON:  None Available. FINDINGS: The heart size and mediastinal contours are within normal  limits. Both lungs are clear. The visualized skeletal structures are unremarkable. IMPRESSION: No active disease. Electronically Signed   By: Helyn Numbers M.D.   On: 06/13/2023 23:55    Microbiology: Results for orders placed or performed during the hospital encounter of 06/13/23  Blood Culture (routine x 2)     Status: None (Preliminary result)   Collection Time: 06/13/23 11:54 PM   Specimen: BLOOD LEFT ARM  Result Value Ref Range Status   Specimen Description BLOOD LEFT ARM  Final   Special Requests   Final    BOTTLES DRAWN AEROBIC AND ANAEROBIC Blood Culture results may not be optimal due to an excessive volume of blood received in culture bottles   Culture   Final    NO GROWTH 2 DAYS Performed at [facility], [address] [place], [place] [address]    Report Status PENDING  Incomplete  Blood Culture (routine x 2)     Status: None (Preliminary result)   Collection Time: 06/13/23 11:54 PM   Specimen: BLOOD RIGHT ARM  Result Value Ref Range Status   Specimen Description BLOOD RIGHT ARM  Final   Special Requests   Final    BOTTLES DRAWN AEROBIC AND ANAEROBIC Blood Culture results may not be optimal due to an excessive volume of blood received in culture bottles   Culture   Final    NO GROWTH 2 DAYS Performed at [facility], [address], [place], [place] [address]    Report Status PENDING  Incomplete  Resp panel by RT-PCR (RSV, Flu A&B, Covid) Anterior Nasal Swab     Status: None   Collection Time: 06/13/23 11:55 PM   Specimen: Anterior Nasal Swab  Result Value Ref Range Status   SARS Coronavirus 2 by RT PCR NEGATIVE NEGATIVE Final    Comment: (NOTE) SARS-CoV-2 target nucleic acids are NOT DETECTED.  The SARS-CoV-2 RNA is generally detectable in upper respiratory specimens during the acute phase of infection. The lowest concentration of SARS-CoV-2 viral copies this assay can detect is 138 copies/mL. A negative result does not preclude  SARS-Cov-2 infection and should not be used as the sole basis for treatment or other patient management decisions. A negative result may occur with  improper specimen collection/handling, submission of specimen other than nasopharyngeal swab, presence of viral mutation(s) within the areas targeted by this assay, and inadequate number of viral copies(<138 copies/mL). A negative result must be combined with clinical observations, patient history, and epidemiological information. The expected result is Negative.  Fact Sheet for Patients:  BloggerCourse.com  Fact Sheet for Healthcare Providers:  SeriousBroker.it  This test is no t yet approved or cleared by the [place] FDA and  has been authorized for detection and/or diagnosis of SARS-CoV-2 by FDA under an Emergency Use Authorization (EUA). This EUA will remain  in effect (meaning this test can be used) for the duration of the COVID-19 declaration under Section 564(b)(1) of the Act, 21 U.S.C.section 360bbb-3(b)(1), unless the authorization is terminated  or revoked sooner.       Influenza A by PCR NEGATIVE NEGATIVE Final  Influenza B by PCR NEGATIVE NEGATIVE Final    Comment: (NOTE) The Xpert Xpress SARS-CoV-2/FLU/RSV plus assay is intended as an aid in the diagnosis of influenza from Nasopharyngeal swab specimens and should not be used as a sole basis for treatment. Nasal washings and aspirates are unacceptable for Xpert Xpress SARS-CoV-2/FLU/RSV testing.  Fact Sheet for Patients: BloggerCourse.com  Fact Sheet for Healthcare Providers: SeriousBroker.it  This test is not yet approved or cleared by the [place] FDA and has been authorized for detection and/or diagnosis of SARS-CoV-2 by FDA under an Emergency Use Authorization (EUA). This EUA will remain in effect (meaning this test can be used) for the duration of  the COVID-19 declaration under Section 564(b)(1) of the Act, 21 U.S.C. section 360bbb-3(b)(1), unless the authorization is terminated or revoked.     Resp Syncytial Virus by PCR NEGATIVE NEGATIVE Final    Comment: (NOTE) Fact Sheet for Patients: BloggerCourse.com  Fact Sheet for Healthcare Providers: SeriousBroker.it  This test is not yet approved or cleared by the [place] FDA and has been authorized for detection and/or diagnosis of SARS-CoV-2 by FDA under an Emergency Use Authorization (EUA). This EUA will remain in effect (meaning this test can be used) for the duration of the COVID-19 declaration under Section 564(b)(1) of the Act, 21 U.S.C. section 360bbb-3(b)(1), unless the authorization is terminated or revoked.  Performed at [facility], [address] [place], [place] [address]   MRSA Next Gen by PCR, Nasal     Status: None   Collection Time: 06/14/23  2:29 PM   Specimen: Nasal Mucosa; Nasal Swab  Result Value Ref Range Status   MRSA by PCR Next Gen NOT DETECTED NOT DETECTED Final    Comment: (NOTE) The GeneXpert MRSA Assay (FDA approved for NASAL specimens only), is one component of a comprehensive MRSA colonization surveillance program. It is not intended to diagnose MRSA infection nor to guide or monitor treatment for MRSA infections. Test performance is not FDA approved in patients less than 41 years old. Performed at [facility], [address] [place], [place] [address]     Labs: CBC: Recent Labs  Lab 06/13/23 2354 06/14/23 0527 06/15/23 0318  WBC 16.7* 16.5* 25.8*  NEUTROABS 16.0*  --   --   HGB 14.4 13.6 13.0  HCT 43.6 40.6 38.4  MCV 91.2 92.7 91.6  PLT 258 173 224   Basic Metabolic Panel: Recent Labs  Lab 06/13/23 2354 06/14/23 0527 06/15/23 0318  NA 133* 137 138  K 3.9 3.7 3.9  CL 100 104 105  CO2 22 20* 25  GLUCOSE 164* 203* 150*  BUN 7 7 11   CREATININE  0.73 0.75  0.80 0.67  CALCIUM 9.4 9.7 9.2  MG 1.4* 1.9 2.4   Liver Function Tests: Recent Labs  Lab 06/13/23 2354  AST 32  ALT 30  ALKPHOS 138*  BILITOT 0.9  PROT 8.2*  ALBUMIN 4.6   CBG: Recent Labs  Lab 06/14/23 1431  GLUCAP 154*    Discharge time spent: greater than 30 minutes.  Signed: Marrion Coy, MD [facility] 06/16/2023

## 2023-06-16 NOTE — Progress Notes (Addendum)
SATURATION QUALIFICATIONS: (This note is used to comply with regulatory documentation for home oxygen)  Patient Saturations on Room Air at Rest = 98 %  Patient Saturations on Room Air while Ambulating = 95%  Patient Saturations on 1 Liters of oxygen while Ambulating = 98%  Please briefly explain why patient needs home oxygen: Patient did well ambulating on room air but did complain of shortness of breath and heart rate did increase to 120's.   Patient stated she feels she does need PRN oxygen at home for shortness of breath and to have when she is sleeping.     Oxygen request discussed with MD Chipper Herb and Clinical Social Worker Darrian Shoffner.  Patient does not qualify for home oxygen at this time.    To determine if patient would qualify for home oxygen PRN/at hour of sleep, an overnight pulse oximetry would need to be conducted with patient on room air.   RN discussed this with the patient and patient declined staying in the hospital over night and requested to proceed with discharge home without oxygen.

## 2023-06-19 LAB — CULTURE, BLOOD (ROUTINE X 2)
Culture: NO GROWTH
Culture: NO GROWTH

## 2023-07-10 IMAGING — CT CT ANGIO CHEST
2 of 6 series · 19 of 46 positions shown · IV contrast (APPLIED)
Comparison: None.

CLINICAL DATA: 50-year-old female with cough, suspected pulmonary
embolism.

EXAM:
CT ANGIOGRAPHY CHEST WITH CONTRAST
TECHNIQUE: Multidetector CT imaging of the chest was performed using the
standard protocol during bolus administration of intravenous
contrast. Multiplanar CT image reconstructions and MIPs were
obtained to evaluate the vascular anatomy.
CONTRAST:  Seventy-five mL Omnipaque 350, intravenous

[Series 5: thins · axial · 0.69mm/px · z∈[-658,-390]mm · 16 of 367 slices shown]
[im 16/367  lung]
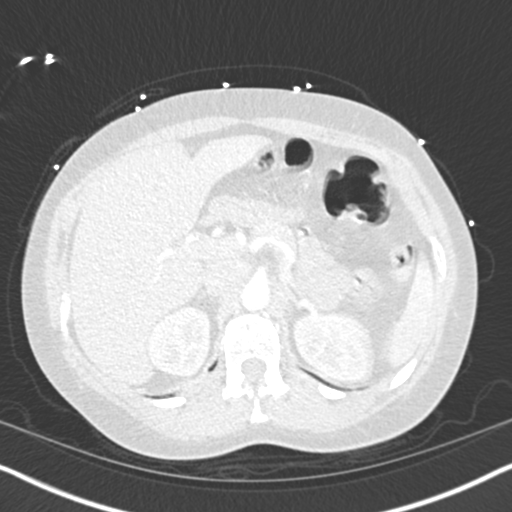
[im 46/367  soft-tissue]
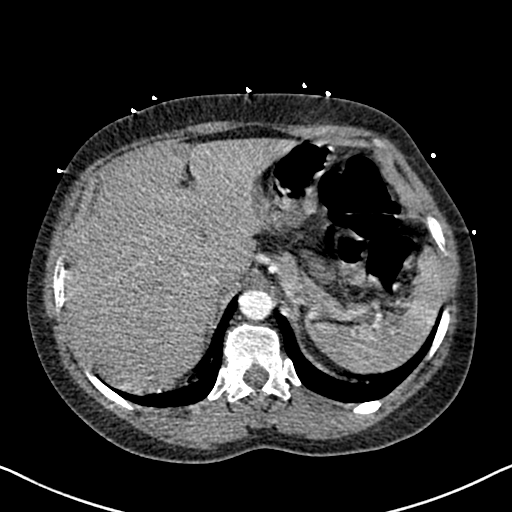
[im 62/367  lung]
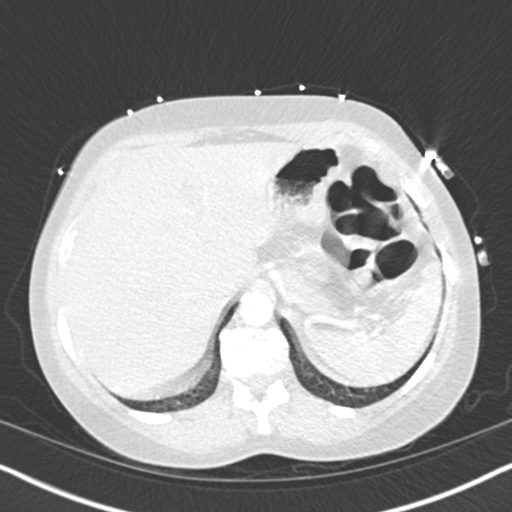
[im 92/367  soft-tissue]
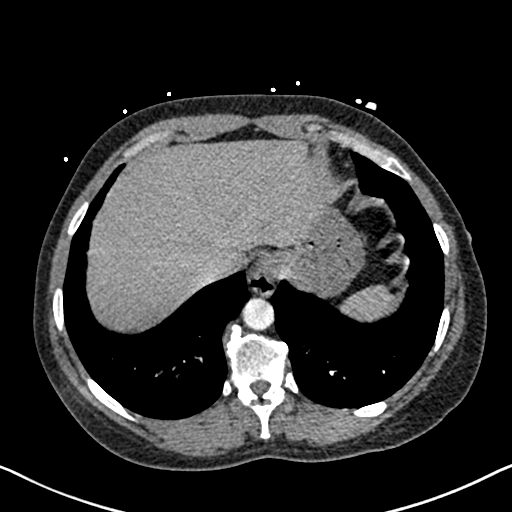
[im 107/367  lung]
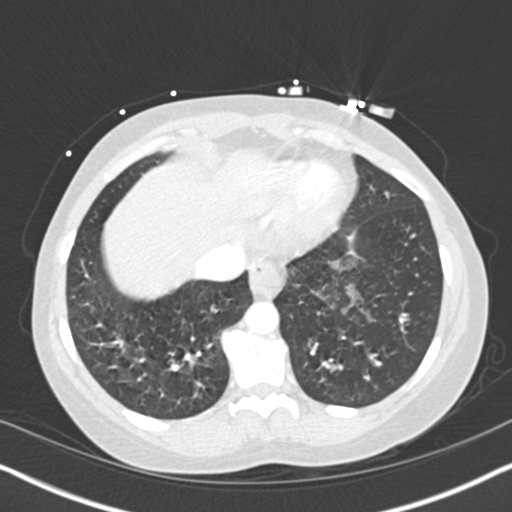
[im 123/367  soft-tissue]
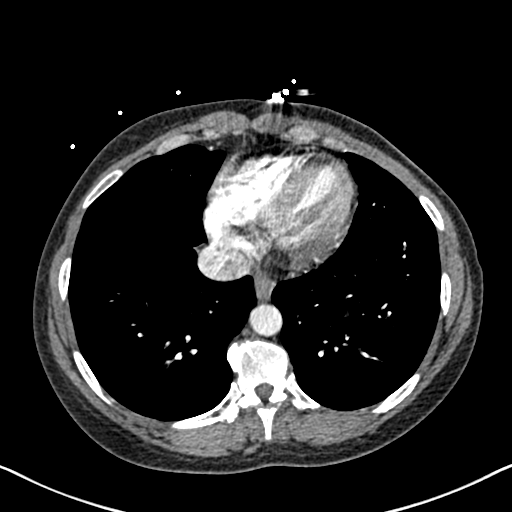
[im 153/367  lung]
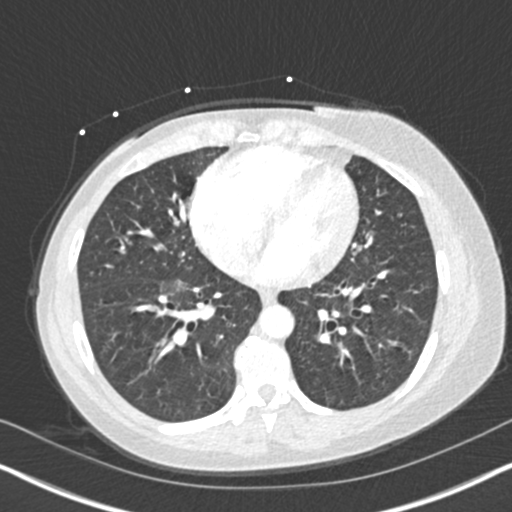
[im 168/367  soft-tissue]
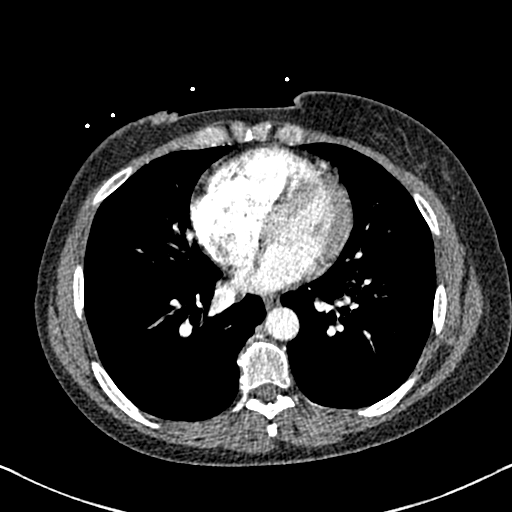
[im 199/367  lung]
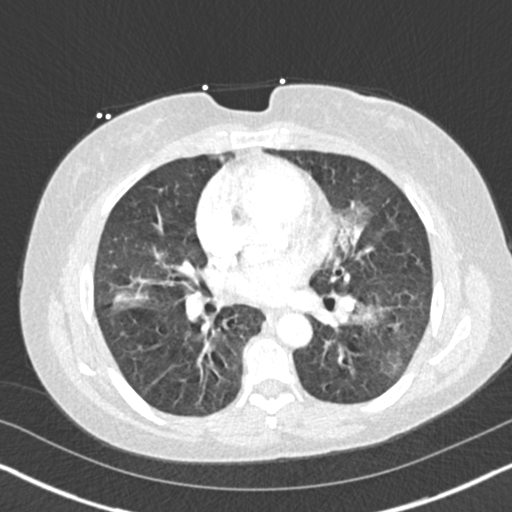
[im 214/367  soft-tissue]
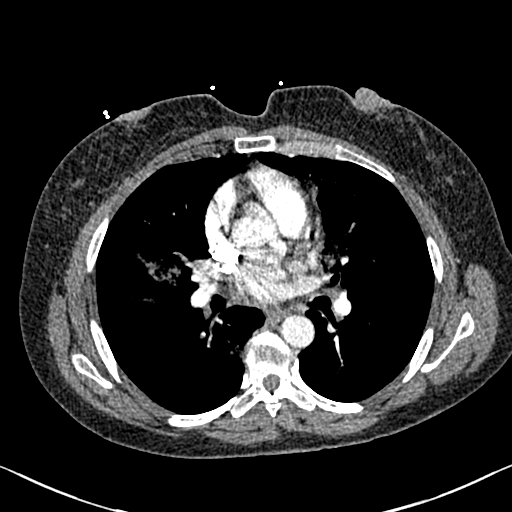
[im 245/367  lung]
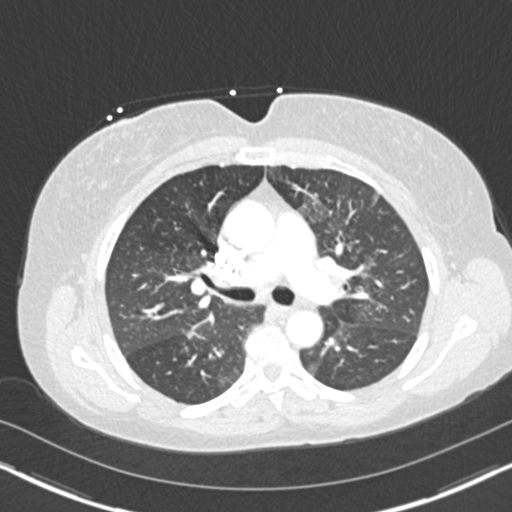
[im 260/367  soft-tissue]
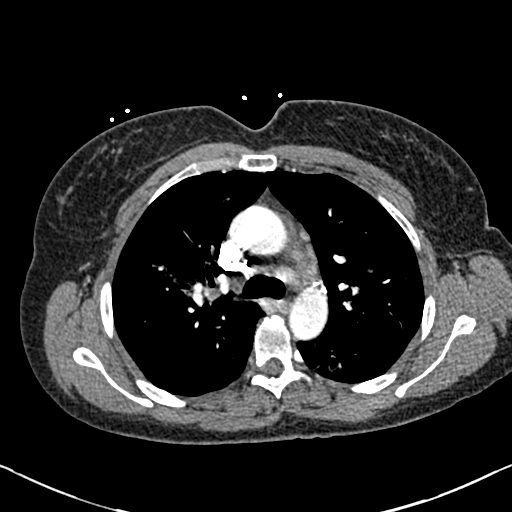
[im 275/367  lung]
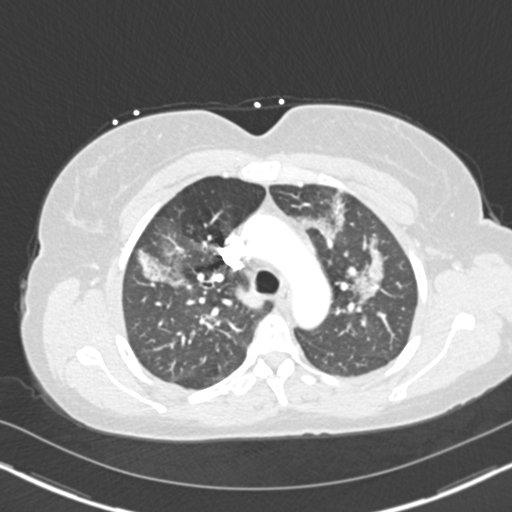
[im 306/367  soft-tissue]
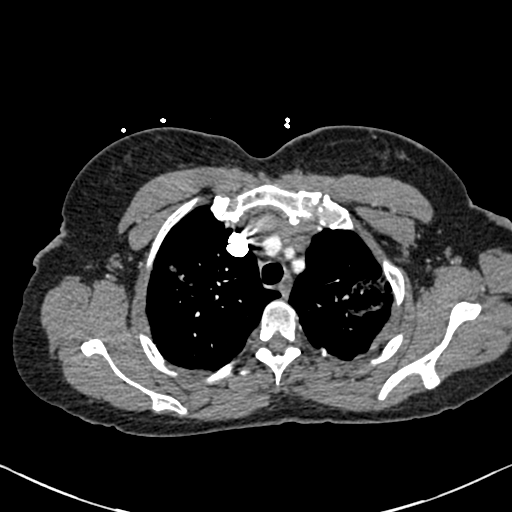
[im 321/367  lung]
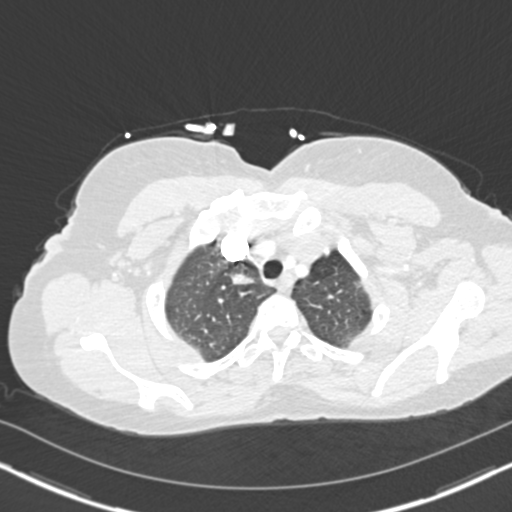
[im 351/367  soft-tissue]
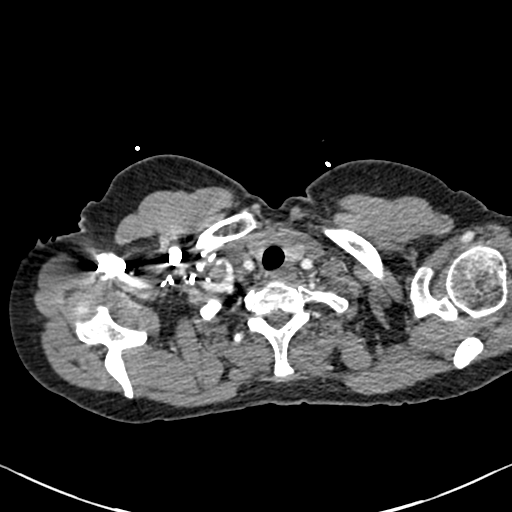

[Series 7: coronal mpr · coronal · 0.64mm/px · 3 of 86 slices shown]
[im 22/86  soft-tissue]
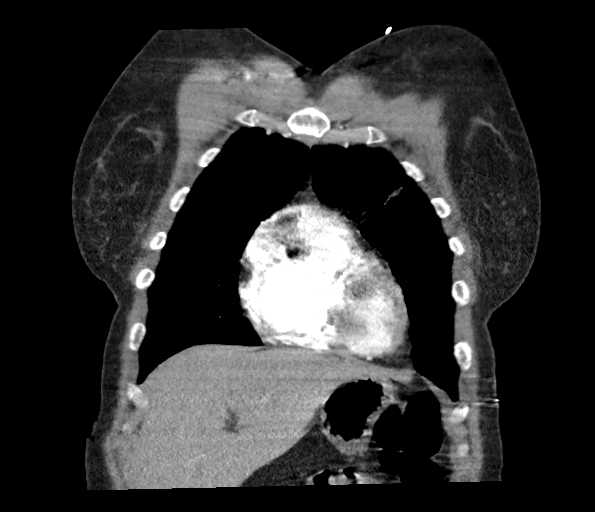
[im 43/86  soft-tissue]
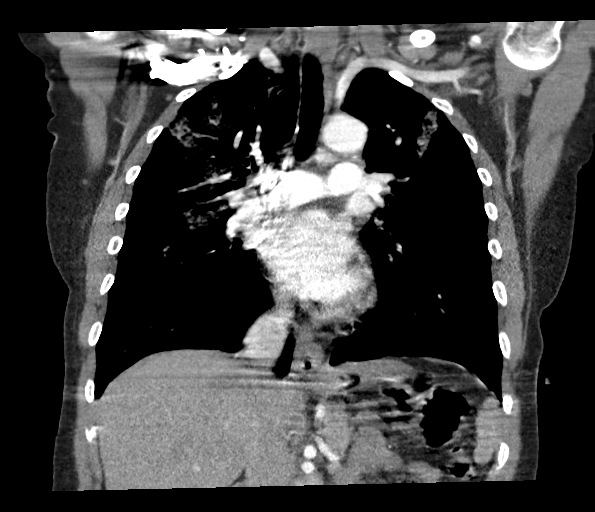
[im 64/86  soft-tissue]
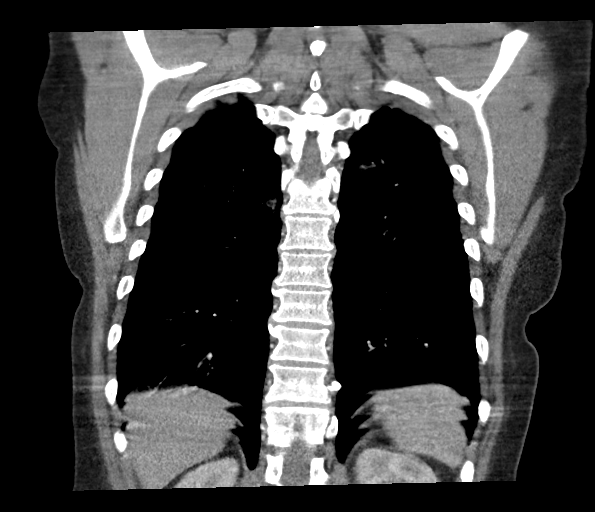

[19 of 46 positions shown; findings below may reference images not displayed]

FINDINGS: Cardiovascular: Satisfactory opacification of the pulmonary arteries
to the segmental level. No evidence of pulmonary embolism. Normal
heart size. No pericardial effusion.

Mediastinum/Nodes: No enlarged mediastinal, hilar, or axillary lymph
nodes. Thyroid gland, trachea, and esophagus demonstrate no
significant findings.

Lungs/Pleura: Scattered bilateral, upper lobe predominant
geographical foci of consolidative opacification. No pneumothorax or
pleural effusion. No suspicious pulmonary nodules.

Upper Abdomen: The visualized upper abdomen is within normal limits.

Musculoskeletal: No chest wall abnormality. No acute or significant
osseous findings.

Review of the MIP images confirms the above findings.
IMPRESSION: Vascular:

No evidence of pulmonary embolism.

Non-Vascular:

Multifocal pneumonia, predominant in the upper lobes.

## 2023-08-17 ENCOUNTER — Emergency Department: Payer: BC Managed Care – PPO

## 2023-08-17 ENCOUNTER — Emergency Department
Admission: EM | Admit: 2023-08-17 | Discharge: 2023-08-17 | Disposition: A | Discharge status: Home / Self Care | Payer: BC Managed Care – PPO | Attending: Emergency Medicine | Admitting: Emergency Medicine

## 2023-08-17 ENCOUNTER — Other Ambulatory Visit: Payer: Self-pay

## 2023-08-17 DIAGNOSIS — J441 Chronic obstructive pulmonary disease with (acute) exacerbation: Secondary | ICD-10-CM | POA: Diagnosis not present

## 2023-08-17 DIAGNOSIS — R059 Cough, unspecified: Secondary | ICD-10-CM | POA: Diagnosis present

## 2023-08-17 DIAGNOSIS — Z20822 Contact with and (suspected) exposure to covid-19: Secondary | ICD-10-CM | POA: Insufficient documentation

## 2023-08-17 LAB — CBC WITH DIFFERENTIAL/PLATELET
Abs Immature Granulocytes: 0.01 10*3/uL (ref 0.00–0.07)
Basophils Absolute: 0.1 10*3/uL (ref 0.0–0.1)
Basophils Relative: 1 %
Eosinophils Absolute: 0.9 10*3/uL — ABNORMAL HIGH (ref 0.0–0.5)
Eosinophils Relative: 19 %
HCT: 43.1 % (ref 36.0–46.0)
Hemoglobin: 13.9 g/dL (ref 12.0–15.0)
Immature Granulocytes: 0 %
Lymphocytes Relative: 40 %
Lymphs Abs: 2.1 10*3/uL (ref 0.7–4.0)
MCH: 30.3 pg (ref 26.0–34.0)
MCHC: 32.3 g/dL (ref 30.0–36.0)
MCV: 93.9 fL (ref 80.0–100.0)
Monocytes Absolute: 0.5 10*3/uL (ref 0.1–1.0)
Monocytes Relative: 10 %
Neutro Abs: 1.5 10*3/uL — ABNORMAL LOW (ref 1.7–7.7)
Neutrophils Relative %: 30 %
Platelets: 285 10*3/uL (ref 150–400)
RBC: 4.59 MIL/uL (ref 3.87–5.11)
RDW: 13.4 % (ref 11.5–15.5)
WBC: 5 10*3/uL (ref 4.0–10.5)
nRBC: 0 % (ref 0.0–0.2)

## 2023-08-17 LAB — COMPREHENSIVE METABOLIC PANEL
ALT: 25 U/L (ref 0–44)
AST: 23 U/L (ref 15–41)
Albumin: 4.5 g/dL (ref 3.5–5.0)
Alkaline Phosphatase: 99 U/L (ref 38–126)
Anion gap: 10 (ref 5–15)
BUN: 8 mg/dL (ref 6–20)
CO2: 25 mmol/L (ref 22–32)
Calcium: 9.1 mg/dL (ref 8.9–10.3)
Chloride: 102 mmol/L (ref 98–111)
Creatinine, Ser: 0.94 mg/dL (ref 0.44–1.00)
GFR, Estimated: >60 mL/min (ref >60)
Glucose, Bld: 105 mg/dL — ABNORMAL HIGH (ref 70–99)
Potassium: 3.7 mmol/L (ref 3.5–5.1)
Sodium: 137 mmol/L (ref 135–145)
Total Bilirubin: 0.5 mg/dL (ref 0.0–1.2)
Total Protein: 7.3 g/dL (ref 6.5–8.1)

## 2023-08-17 LAB — RESP PANEL BY RT-PCR (RSV, FLU A&B, COVID)  RVPGX2
Influenza A by PCR: NEGATIVE
Influenza B by PCR: NEGATIVE
Resp Syncytial Virus by PCR: NEGATIVE
SARS Coronavirus 2 by RT PCR: NEGATIVE

## 2023-08-17 LAB — D-DIMER, QUANTITATIVE: D-Dimer, Quant: <0.27 ug{FEU}/mL (ref 0.00–0.50)

## 2023-08-17 LAB — TROPONIN I (HIGH SENSITIVITY): Troponin I (High Sensitivity): 4 ng/L (ref <18)

## 2023-08-17 MED ORDER — PREDNISONE 10 MG PO TABS: 40.0000 mg | ORAL_TABLET | Freq: Every day | ORAL | 0 refills | Status: AC

## 2023-08-17 MED ORDER — AMOXICILLIN-POT CLAVULANATE 875-125 MG PO TABS: 1.0000 | ORAL_TABLET | Freq: Two times a day (BID) | ORAL | 0 refills | Status: AC

## 2023-08-17 MED ORDER — PREDNISONE 20 MG PO TABS
60.0000 mg | ORAL_TABLET | Freq: Once | ORAL | Status: AC
  Administered 2023-08-17: 60 mg via ORAL
  Filled 2023-08-17: qty 3

## 2023-08-17 MED ORDER — IPRATROPIUM-ALBUTEROL 0.5-2.5 (3) MG/3ML IN SOLN
3.0000 mL | Freq: Once | RESPIRATORY_TRACT | Status: AC
  Administered 2023-08-17: 3 mL via RESPIRATORY_TRACT
  Filled 2023-08-17: qty 3

## 2023-08-17 NOTE — ED Triage Notes (Signed)
 Pt says she has had a productive cough for a while and reports onset of upper mid chest pain 2 days ago. Pt says cough makes the pain worse. Pt also reports cramps throughout her torso and abdomen from coughing.

## 2023-08-17 NOTE — ED Provider Notes (Signed)
 [facility] Provider Note    Event Date/Time   First MD Initiated Contact with Patient 08/17/23 1335     (approximate)   History   Chest Pain and Cough   HPI Nicole Mason is a 53 y.o. female with COPD presenting today for cough and chest pain.  Patient states for several days now she has had lingering nonproductive cough, chest pain with coughing and deep breaths, and shortness of breath.  Patient states previously seeing her primary care provider a couple weeks ago and being placed on steroids and antibiotics which did initially help symptoms and then they returned.  Has not consistently been using her albuterol  inhaler at home.  Otherwise denies fever, nausea, vomiting, abdominal pain, leg pain, leg swelling.  No prior history of blood clots.  Chart review: Patient admitted to hospital in early November 2024 for COPD exacerbation secondary to pneumonia.     Physical Exam   Triage Vital Signs: ED Triage Vitals  Encounter Vitals Group     BP 08/17/23 1123 (!) 144/87     Systolic BP Percentile --      Diastolic BP Percentile --      Pulse Rate 08/17/23 1123 (!) 108     Resp 08/17/23 1123 19     Temp 08/17/23 1123 99 F (37.2 C)     Temp Source 08/17/23 1123 Oral     SpO2 08/17/23 1123 93 %     Weight --      Height --      Head Circumference --      Peak Flow --      Pain Score 08/17/23 1121 8     Pain Loc --      Pain Education --      Exclude from Growth Chart --     Most recent vital signs: Vitals:   08/17/23 1123  BP: (!) 144/87  Pulse: (!) 108  Resp: 19  Temp: 99 F (37.2 C)  SpO2: 93%   Physical Exam: I have reviewed the vital signs and nursing notes. General: Awake, alert, no acute distress.  Nontoxic appearing. Head:  Atraumatic, normocephalic.   ENT:  EOM intact, PERRL. Oral mucosa is pink and moist with no lesions. Neck: Neck is supple with full range of motion, No meningeal signs. Cardiovascular:  RRR, No murmurs.  Peripheral pulses palpable and equal bilaterally. Respiratory:  Symmetrical chest wall expansion.  Expiratory wheezing throughout all lung fields.  Good air movement throughout.  No use of accessory muscles.   Musculoskeletal:  No cyanosis or edema. Moving extremities with full ROM Abdomen:  Soft, nontender, nondistended. Neuro:  GCS 15, moving all four extremities, interacting appropriately. Speech clear. Psych:  Calm, appropriate.   Skin:  Warm, dry, no rash.    ED Results / Procedures / Treatments   Labs (all labs ordered are listed, but only abnormal results are displayed) Labs Reviewed  CBC WITH DIFFERENTIAL/PLATELET - Abnormal; Notable for the following components:      Result Value   Neutro Abs 1.5 (*)    Eosinophils Absolute 0.9 (*)    All other components within normal limits  COMPREHENSIVE METABOLIC PANEL - Abnormal; Notable for the following components:   Glucose, Bld 105 (*)    All other components within normal limits  RESP PANEL BY RT-PCR (RSV, FLU A&B, COVID)  RVPGX2  D-DIMER, QUANTITATIVE (NOT AT [facility])  TROPONIN I (HIGH SENSITIVITY)     EKG My EKG interpretation: Rate of  108, sinus tachycardia.  Normal axis, normal intervals.  No acute ST elevations or depressions   RADIOLOGY Interpreted chest x-ray with no acute pathology   PROCEDURES:  Critical Care performed: No  Procedures   MEDICATIONS ORDERED IN ED: Medications  ipratropium-albuterol  (DUONEB) 0.5-2.5 (3) MG/3ML nebulizer solution 3 mL (3 mLs Nebulization Given 08/17/23 1358)  predniSONE  (DELTASONE ) tablet 60 mg (60 mg Oral Given 08/17/23 1357)     IMPRESSION / MDM / ASSESSMENT AND PLAN / ED COURSE  I reviewed the triage vital signs and the nursing notes.                              Differential diagnosis includes, but is not limited to, COPD exacerbation, viral URI, pneumonia, lower suspicion PE or ACS  Patient's presentation is most consistent with acute complicated illness / injury  requiring diagnostic workup.  Patient is a 53 year old female presenting today for cough, chest pain, and shortness of breath.  Initial tachycardia on arrival but otherwise afebrile and no hypoxia or significant tachypnea.  End expiratory wheezing is present throughout likely representing COPD exacerbation as the source of most of her symptoms.  Viral swabs negative for COVID, flu and RSV.  Chest x-ray with no evidence of pneumonia.  CBC and CMP otherwise unremarkable.  Troponin negative and EKG without signs of ischemia.  D-dimer negative.  Wheezing is resolved after breathing treatment.  Will discharge patient treating COPD exacerbation with prednisone  and Augmentin .  Patient agreeable with plan and has follow-up with her PCP in 3 days.  Given strict return precautions.  The patient is on the cardiac monitor to evaluate for evidence of arrhythmia and/or significant heart rate changes.     FINAL CLINICAL IMPRESSION(S) / ED DIAGNOSES   Final diagnoses:  COPD exacerbation (HCC)     Rx / DC Orders   ED Discharge Orders          Ordered    predniSONE  (DELTASONE ) 10 MG tablet  Daily        08/17/23 1458    amoxicillin -clavulanate (AUGMENTIN ) 875-125 MG tablet  2 times daily        08/17/23 1458             Note:  This document was prepared using Dragon voice recognition software and may include unintentional dictation errors.   Malvina Alm DASEN, MD 08/17/23 [phone]

## 2023-09-21 ENCOUNTER — Emergency Department: Payer: BC Managed Care – PPO

## 2023-09-21 ENCOUNTER — Emergency Department
Admission: EM | Admit: 2023-09-21 | Discharge: 2023-10-11 | Disposition: E | Payer: BC Managed Care – PPO | Attending: Emergency Medicine | Admitting: Emergency Medicine

## 2023-09-21 DIAGNOSIS — J441 Chronic obstructive pulmonary disease with (acute) exacerbation: Secondary | ICD-10-CM | POA: Diagnosis not present

## 2023-09-21 DIAGNOSIS — Z7982 Long term (current) use of aspirin: Secondary | ICD-10-CM | POA: Insufficient documentation

## 2023-09-21 DIAGNOSIS — R092 Respiratory arrest: Secondary | ICD-10-CM | POA: Insufficient documentation

## 2023-09-21 DIAGNOSIS — J4552 Severe persistent asthma with status asthmaticus: Secondary | ICD-10-CM | POA: Diagnosis not present

## 2023-09-21 MED ORDER — EPINEPHRINE 1 MG/10ML IJ SOSY
PREFILLED_SYRINGE | INTRAMUSCULAR | Status: AC
  Filled 2023-09-21: qty 50

## 2023-09-21 MED ORDER — IPRATROPIUM-ALBUTEROL 0.5-2.5 (3) MG/3ML IN SOLN
RESPIRATORY_TRACT | Status: AC
  Filled 2023-09-21: qty 9

## 2023-09-21 NOTE — ED Provider Notes (Signed)
 [facility] Provider Note    Event Date/Time   First MD Initiated Contact with Patient 10/10/23 2325     (approximate)   History   CPR   HPI  Level V caveat: Limited by patient unresponsive  Nicole Mason is a 53 y.o. female brought to the ED via EMS from home CPR in progress.  Patient with a history of asthma/COPD.  Called out for shortness of breath.  Given nebulizer, placed on BiPAP when she had witnessed respiratory arrest.  EMS reports patient had severe bradycardia into asystole.  Given epi and atropine  prior to arrival.  Blood sugar unremarkable.  Arrives to the ED with Igel in place, being bagged, Lucas compression device in place.  Rest of history is currently unobtainable secondary to patient's distress.     Past Medical History   Past Medical History:  Diagnosis Date   Asthma    COPD (chronic obstructive pulmonary disease) (HCC)      Active Problem List   Patient Active Problem List   Diagnosis Date Noted   OSA on CPAP 06/14/2023   Septic shock (HCC) 06/14/2023   Acute exacerbation of COPD with asthma (HCC) 06/02/2022   Depression 06/02/2022   GERD without esophagitis 06/02/2022   Acute respiratory failure with hypoxia (HCC) 06/02/2022   Chest pain 06/02/2022   Sepsis due to pneumonia (HCC) 04/07/2021   Shortness of breath 04/06/2021     Past Surgical History   Past Surgical History:  Procedure Laterality Date   TUBAL LIGATION       Home Medications   Prior to Admission medications   Medication Sig Start Date End Date Taking? Authorizing Provider  acetaminophen  (TYLENOL ) 650 MG CR tablet Take 650 mg by mouth every 8 (eight) hours as needed.    [provider]  albuterol  (VENTOLIN  HFA) 108 (90 Base) MCG/ACT inhaler Inhale 2 puffs into the lungs every 4 (four) hours as needed for shortness of breath or wheezing.    [provider]  Nicole Mason 62.5-25 MCG/ACT AEPB Inhale 1 puff into the lungs daily.  05/16/22   [provider]  aspirin  EC 81 MG tablet Take 81 mg by mouth daily.    [provider]  buPROPion  (WELLBUTRIN  XL) 150 MG 24 hr tablet Take 150 mg by mouth daily. 03/22/22   [provider]  ipratropium-albuterol  (DUONEB) 0.5-2.5 (3) MG/3ML SOLN Take 3 mLs by nebulization every 4 (four) hours as needed. 06/16/23   Laurita Pillion, MD  SPIRIVA HANDIHALER 18 MCG inhalation capsule Place 18 mcg into inhaler and inhale daily.    [provider]  venlafaxine  XR (EFFEXOR -XR) 75 MG 24 hr capsule Take 75 mg by mouth daily. 05/16/22   [provider]     Allergies  Oxycodone-acetaminophen    Family History   Family History  Problem Relation Age of Onset   Breast cancer Mother 31     Physical Exam  Triage Vital Signs: ED Triage Vitals  Encounter Vitals Group     BP      Systolic BP Percentile      Diastolic BP Percentile      Pulse      Resp      Temp      Temp src      SpO2      Weight      Height      Head Circumference      Peak Flow      Pain Score  Pain Loc      Pain Education      Exclude from Growth Chart     Updated Vital Signs: BP (!) 57/24   LMP 07/15/2014 (Approximate)    General: Unresponsive, severe distress.  CV:  No spontaneous pulse.  Palpable pulses with CPR.  Resp:  No respiratory effort.  Bagged via i-gel. Abd:  Moderate distention.  Other:  No movement, unresponsive.   ED Results / Procedures / Treatments  Labs (all labs ordered are listed, but only abnormal results are displayed) Labs Reviewed  CBC WITH DIFFERENTIAL/PLATELET  COMPREHENSIVE METABOLIC PANEL  TROPONIN I (HIGH SENSITIVITY)  TROPONIN I (HIGH SENSITIVITY)     EKG  None   RADIOLOGY None   Official radiology report(s): No results found.   PROCEDURES:  Critical Care performed: Yes, see critical care procedure note(s) CRITICAL CARE Performed by: ROBINETTE VERMELL PARAS   Total critical care time: 30 minutes  Critical care  time was exclusive of separately billable procedures and treating other patients.  Critical care was necessary to treat or prevent imminent or life-threatening deterioration.  Critical care was time spent personally by me on the following activities: development of treatment plan with patient and/or surrogate as well as nursing, discussions with consultants, evaluation of patient's response to treatment, examination of patient, obtaining history from patient or surrogate, ordering and performing treatments and interventions, ordering and review of laboratory studies, ordering and review of radiographic studies, pulse oximetry and re-evaluation of patient's condition.   Procedure Name: Intubation Date/Time: 10/20/23 11:58 PM  Performed by: Robinette Vermell PARAS, MDPre-anesthesia Checklist: Patient identified, Emergency Drugs available, Suction available and Patient being monitored Oxygen Delivery Method: Ambu bag Preoxygenation: Pre-oxygenation with 100% oxygen Induction Type: Rapid sequence and Cricoid Pressure applied Ventilation: Two handed mask ventilation required Laryngoscope Size: Glidescope and 3 Grade View: Grade II Tube size: 7.5 mm Number of attempts: 1 Airway Equipment and Method: Rigid stylet Placement Confirmation: ETT inserted through vocal cords under direct vision, Positive ETCO2, CO2 detector and Breath sounds checked- equal and bilateral Dental Injury: Teeth and Oropharynx as per pre-operative assessment  Difficulty Due To: Difficult Airway- due to large tongue and Difficult Airway- due to reduced neck mobility    CPR  Date/Time: 2023/10/20 11:59 PM  Performed by: Robinette Vermell PARAS, MD Authorized by: Robinette Vermell PARAS, MD  CPR Procedure Details:      Amount of time prior to administration of ACLS/BLS (minutes):  0   ACLS/BLS initiated by EMS: Yes     CPR/ACLS performed in the ED: Yes     Duration of CPR (minutes):  31   Outcome: Pt declared dead    CPR performed via ACLS guidelines  under my direct supervision.  See RN documentation for details including defibrillator use, medications, doses and timing. Comments:     EMS performed CPR/resuscitation >30 minutes prior to arrival    MEDICATIONS ORDERED IN ED: Medications  ipratropium-albuterol  (DUONEB) 0.5-2.5 (3) MG/3ML nebulizer solution (  Given 10/20/2023 2326)  EPINEPHrine  (ADRENALIN ) 1 MG/10ML injection (1 mg Intravenous Given Oct 20, 2023 2335)  atropine  1 MG/10ML injection (1 mg Intravenous Given 2023-10-20 2312)  sodium bicarbonate  injection (50 mEq Intravenous Given 10/20/2023 2318)  EPINEPHrine  (ADRENALIN ) 1 MG/10ML injection ( Intravenous Canceled Entry 10-20-2023 2345)  magnesium  sulfate (IV Push/IM) injection (2 g Intravenous Given 10-20-2023 2309)  calcium  chloride injection (1 g Intravenous Given October 20, 2023 2313)  norepinephrine  (LEVOPHED ) 4mg  in (0.016 mg/mL) premix infusion (0 mcg/kg/min Intravenous Stopped 10/20/2023 2338)     IMPRESSION /  MDM / ASSESSMENT AND PLAN / ED COURSE  I reviewed the triage vital signs and the nursing notes.                             53 year old female with COPD/asthma called out to EMS for breathing difficulty; given nebulizer treatment, BiPAP, witnessed respiratory arrest.  Epi/atropine /CPR and route to the ED.  Patient was intubated upon her arrival to the ED.  CPR continued, multiple amps epi, atropine , sodium bicarbonate , calcium  gluconate, magnesium  given with with brief ROSC but quickly lost pulses.  CPR/resuscitation resumed, patient remained in asystole..  Patient's presentation is most consistent with acute presentation with potential threat to life or bodily function.  Spouse invited to come back to bedside to witness CPR/resuscitation.  States she has been sick with flu-like symptoms this week.  TOD 2338  Clinical Course as of 09/22/23 9388  [place] 10/10/2023  2347 Discussed case with medical examiner; given patient's history of asthma/COPD and witnessed respiratory arrest, patient  will not be a candidate for the medical examiner.  ET tube and IVs will be removed. [JS]    Clinical Course User Index [JS] Robinette Vermell PARAS, MD     FINAL CLINICAL IMPRESSION(S) / ED DIAGNOSES   Final diagnoses:  Respiratory arrest (HCC)  Chronic obstructive pulmonary disease with acute exacerbation (HCC)  Severe persistent asthma with status asthmaticus     Rx / DC Orders   ED Discharge Orders     None        Note:  This document was prepared using Dragon voice recognition software and may include unintentional dictation errors.   Robinette Vermell PARAS, MD 09/22/23 [phone]

## 2023-09-22 MED ORDER — NOREPINEPHRINE 4 MG/250ML-% IV SOLN
INTRAVENOUS | Status: AC | PRN
  Administered 2023-09-21: 20 ug/min via INTRAVENOUS

## 2023-09-22 MED ORDER — EPINEPHRINE 1 MG/10ML IJ SOSY
PREFILLED_SYRINGE | INTRAMUSCULAR | Status: AC | PRN
  Administered 2023-09-21 (×4): 1 mg via INTRAVENOUS

## 2023-09-22 MED ORDER — MAGNESIUM SULFATE 50 % IJ SOLN
INTRAMUSCULAR | Status: AC | PRN
  Administered 2023-09-21: 2 g via INTRAVENOUS

## 2023-09-22 MED ORDER — CALCIUM CHLORIDE 10 % IV SOLN
INTRAVENOUS | Status: AC | PRN
  Administered 2023-09-21: 1 g via INTRAVENOUS

## 2023-09-22 MED ORDER — ATROPINE SULFATE 1 MG/10ML IJ SOSY
PREFILLED_SYRINGE | INTRAMUSCULAR | Status: AC | PRN
  Administered 2023-09-21 (×2): 1 mg via INTRAVENOUS

## 2023-09-22 MED ORDER — SODIUM BICARBONATE 8.4 % IV SOLN
INTRAVENOUS | Status: AC | PRN
  Administered 2023-09-21 (×2): 50 meq via INTRAVENOUS

## 2023-09-22 MED ORDER — EPINEPHRINE 1 MG/10ML IJ SOSY
PREFILLED_SYRINGE | INTRAMUSCULAR | Status: AC | PRN
  Administered 2023-09-21 (×3): 1 mg via INTRAVENOUS

## 2023-10-11 NOTE — Progress Notes (Signed)
 Chaplain provided family supportive presence during medical event. Husband at bedside deeply grieving Shannyn's sudden death. Son and BIL arrived as well as a cousin. Family is grieving with support. Family aware a funeral home will be needed.     09/22/23 0000  Spiritual Encounters  Type of Visit Initial  Care provided to: Pt and family  Conversation partners present during encounter Physician  Referral source Nurse (RN/NT/LPN)  Reason for visit Code  OnCall Visit Yes

## 2023-10-11 NOTE — Progress Notes (Signed)
 0219/25 1000-1020: I had spoken with Nicole Mason by telephone on 09/30/23 at 1600. Yesterday hae had some questions about how his wife died and wanted her medical records. I gave him the medical records [facility] at [facility]. I felt he still had some concerns. I called Nicole Mason this morning and asked if he had concerns about his wife's care. He stated he didn't understand how she died. I explained how having respiratory difficulties can put a strain on a person's heart and that she came in with cardiac arrest. After speaking with him, he did not have other questions. He asked if I could email medical records to him and told him he had to get those through medical records. Explained death certificate process. Encouraged him to call if he had other concerns.

## 2023-10-11 DEATH — deceased
